# Patient Record
Sex: Female | Born: 1960 | Race: White | Hispanic: No | Marital: Married | Smoking: Never smoker
Health system: Southern US, Community
[De-identification: ages and names within clinical notes are randomized; demographics above are authoritative.]

## PROBLEM LIST (undated history)

## (undated) DIAGNOSIS — N3010 Interstitial cystitis (chronic) without hematuria: Secondary | ICD-10-CM

## (undated) DIAGNOSIS — Z9889 Other specified postprocedural states: Secondary | ICD-10-CM

## (undated) DIAGNOSIS — T8859XA Other complications of anesthesia, initial encounter: Secondary | ICD-10-CM

## (undated) DIAGNOSIS — R112 Nausea with vomiting, unspecified: Secondary | ICD-10-CM

## (undated) DIAGNOSIS — E039 Hypothyroidism, unspecified: Secondary | ICD-10-CM

## (undated) DIAGNOSIS — I10 Essential (primary) hypertension: Secondary | ICD-10-CM

## (undated) DIAGNOSIS — M5416 Radiculopathy, lumbar region: Secondary | ICD-10-CM

## (undated) HISTORY — DX: Interstitial cystitis (chronic) without hematuria: N30.10

## (undated) HISTORY — DX: Hypothyroidism, unspecified: E03.9

## (undated) HISTORY — PX: CHOLECYSTECTOMY: SHX55

## (undated) HISTORY — PX: OTHER SURGICAL HISTORY: SHX169

## (undated) HISTORY — DX: Essential (primary) hypertension: I10

## (undated) HISTORY — DX: Radiculopathy, lumbar region: M54.16

## (undated) HISTORY — PX: ABDOMINAL HYSTERECTOMY: SHX81

---

## 1999-11-24 HISTORY — PX: ABDOMINAL HYSTERECTOMY: SHX81

## 2014-02-21 ENCOUNTER — Encounter (INDEPENDENT_AMBULATORY_CARE_PROVIDER_SITE_OTHER): Payer: Self-pay | Admitting: *Deleted

## 2014-03-12 ENCOUNTER — Ambulatory Visit (INDEPENDENT_AMBULATORY_CARE_PROVIDER_SITE_OTHER): Payer: BC Managed Care – PPO | Admitting: Internal Medicine

## 2014-03-12 ENCOUNTER — Encounter (INDEPENDENT_AMBULATORY_CARE_PROVIDER_SITE_OTHER): Payer: Self-pay | Admitting: Internal Medicine

## 2014-03-12 ENCOUNTER — Other Ambulatory Visit (INDEPENDENT_AMBULATORY_CARE_PROVIDER_SITE_OTHER): Payer: Self-pay | Admitting: *Deleted

## 2014-03-12 ENCOUNTER — Telehealth (INDEPENDENT_AMBULATORY_CARE_PROVIDER_SITE_OTHER): Payer: Self-pay | Admitting: *Deleted

## 2014-03-12 VITALS — BP 124/82 | HR 72 | Temp 98.4°F | Ht 65.0 in | Wt 250.9 lb

## 2014-03-12 DIAGNOSIS — E039 Hypothyroidism, unspecified: Secondary | ICD-10-CM | POA: Insufficient documentation

## 2014-03-12 DIAGNOSIS — I10 Essential (primary) hypertension: Secondary | ICD-10-CM | POA: Insufficient documentation

## 2014-03-12 DIAGNOSIS — Z1211 Encounter for screening for malignant neoplasm of colon: Secondary | ICD-10-CM

## 2014-03-12 DIAGNOSIS — R1032 Left lower quadrant pain: Secondary | ICD-10-CM

## 2014-03-12 DIAGNOSIS — R10A2 Flank pain, left side: Secondary | ICD-10-CM

## 2014-03-12 DIAGNOSIS — R109 Unspecified abdominal pain: Secondary | ICD-10-CM

## 2014-03-12 MED ORDER — PEG 3350-KCL-NA BICARB-NACL 420 G PO SOLR: 4000.0000 mL | Freq: Once | ORAL | Status: DC

## 2014-03-12 NOTE — Telephone Encounter (Signed)
Patient needs trilyte 

## 2014-03-12 NOTE — Patient Instructions (Signed)
Screening colonoscopy.The risks and benefits such as perforation, bleeding, and infection were reviewed with the patient and is agreeable. 

## 2014-03-12 NOTE — Progress Notes (Signed)
Subjective:     Patient ID: Jasmine Lawson, female   DOB: 1961-07-13, 53 y.o.   MRN: [id number]  HPI Referred to our office by Dr. Quintin Alto for abdominal pain. She has never undergone a colonoscopy in the past.   She tells me when she has abdominal pain, it will be in her lower abdomen radiating into her back. She has a hx of hematuria. She has been seen by [facility] [place] for this. Kidney stone was ruled out. She continues to have blood in her urine. Symptoms off on an on for about for 1 1/2 yr. If she leans over sharp, she will have pain under her left ribs.  06/09/2013 CT scan with and without contrast for hematuria:  A specific cause for hematuria is not observed. 35mm left lower lobe pulmonary nodule. Mild sclerosis in the left pubic body, possible from pubic instability or low grade arthropathy. Appetite is good. No weight. When she has the abdominal pain, it will be in her left lower quadrant radiating up into her back.  She may have nausea with the pain.   Review of Systems Past Medical History  Diagnosis Date  . Hypothyroid   . Hypertension     Past Surgical History  Procedure Laterality Date  . Cesarean section      x 2  . Rt rotator cuff repair    . Cholecystectomy      non-functioning by Dr. Lindalou Hose  . Complete hysterectomy    . Fissure tear repair      Allergies  Allergen Reactions  . Codeine     Vomiting.    No current outpatient prescriptions on file prior to visit.   No current facility-administered medications on file prior to visit.         Objective:   Physical Exam  Filed Vitals:   03/12/14 1458  BP: 124/82  Pulse: 72  Temp: 98.4 F (36.9 C)  Height: 5\' 5"  (1.651 m)  Weight: 250 lb 14.4 oz (113.807 kg)  Alert and oriented. Skin warm and dry. Oral mucosa is moist.   . Sclera anicteric, conjunctivae is pink. Thyroid not enlarged. No cervical lymphadenopathy. Lungs clear. Heart regular rate and rhythm.  Abdomen is soft. Bowel sounds are positive.  No hepatomegaly. No abdominal masses felt. Slight tenderness left lower quadrant and left flank/back.   No edema to lower extremities.        Assessment:    Left lower quadrant pain radiating into her back. Doubt this is GI related. She gives a hx of hematuria. She has never undergone a colonoscopy in the past. Colonic neoplasm needs to be ruled out.    Plan:    Colonoscopy.The risks and benefits such as perforation, bleeding, and infection were reviewed with the patient and is agreeable.

## 2014-03-30 ENCOUNTER — Encounter (HOSPITAL_COMMUNITY): Payer: Self-pay | Admitting: Pharmacy Technician

## 2014-04-12 ENCOUNTER — Ambulatory Visit (HOSPITAL_COMMUNITY)
Admission: RE | Admit: 2014-04-12 | Discharge: 2014-04-12 | Disposition: A | Discharge status: Home / Self Care | Payer: BC Managed Care – PPO | Source: Ambulatory Visit | Attending: Internal Medicine | Admitting: Internal Medicine

## 2014-04-12 ENCOUNTER — Encounter (HOSPITAL_COMMUNITY)
Admission: RE | Disposition: A | Discharge status: Home / Self Care | Payer: Self-pay | Source: Ambulatory Visit | Attending: Internal Medicine

## 2014-04-12 ENCOUNTER — Encounter (HOSPITAL_COMMUNITY): Payer: Self-pay | Admitting: *Deleted

## 2014-04-12 DIAGNOSIS — Z885 Allergy status to narcotic agent status: Secondary | ICD-10-CM | POA: Insufficient documentation

## 2014-04-12 DIAGNOSIS — K644 Residual hemorrhoidal skin tags: Secondary | ICD-10-CM

## 2014-04-12 DIAGNOSIS — Z79899 Other long term (current) drug therapy: Secondary | ICD-10-CM | POA: Insufficient documentation

## 2014-04-12 DIAGNOSIS — R1032 Left lower quadrant pain: Secondary | ICD-10-CM

## 2014-04-12 DIAGNOSIS — E039 Hypothyroidism, unspecified: Secondary | ICD-10-CM | POA: Insufficient documentation

## 2014-04-12 DIAGNOSIS — Z1211 Encounter for screening for malignant neoplasm of colon: Secondary | ICD-10-CM

## 2014-04-12 DIAGNOSIS — I10 Essential (primary) hypertension: Secondary | ICD-10-CM | POA: Insufficient documentation

## 2014-04-12 DIAGNOSIS — K5730 Diverticulosis of large intestine without perforation or abscess without bleeding: Secondary | ICD-10-CM

## 2014-04-12 DIAGNOSIS — D126 Benign neoplasm of colon, unspecified: Secondary | ICD-10-CM

## 2014-04-12 HISTORY — PX: COLONOSCOPY: SHX5424

## 2014-04-12 SURGERY — COLONOSCOPY
Anesthesia: Moderate Sedation

## 2014-04-12 MED ORDER — SODIUM CHLORIDE 0.9 % IV SOLN
INTRAVENOUS | Status: DC
  Administered 2014-04-12: 07:00:00 via INTRAVENOUS

## 2014-04-12 MED ORDER — SIMETHICONE 40 MG/0.6ML PO SUSP
ORAL | Status: DC | PRN
  Administered 2014-04-12: 08:00:00

## 2014-04-12 MED ORDER — DICYCLOMINE HCL 10 MG PO CAPS: 10.0000 mg | ORAL_CAPSULE | Freq: Three times a day (TID) | ORAL | Status: DC | PRN

## 2014-04-12 MED ORDER — MIDAZOLAM HCL 5 MG/5ML IJ SOLN
INTRAMUSCULAR | Status: DC | PRN
  Administered 2014-04-12 (×5): 2 mg via INTRAVENOUS

## 2014-04-12 MED ORDER — MIDAZOLAM HCL 5 MG/5ML IJ SOLN
INTRAMUSCULAR | Status: AC
  Filled 2014-04-12: qty 10

## 2014-04-12 MED ORDER — MEPERIDINE HCL 50 MG/ML IJ SOLN
INTRAMUSCULAR | Status: DC | PRN
  Administered 2014-04-12 (×2): 25 mg via INTRAVENOUS

## 2014-04-12 MED ORDER — MEPERIDINE HCL 50 MG/ML IJ SOLN
INTRAMUSCULAR | Status: AC
  Filled 2014-04-12: qty 1

## 2014-04-12 NOTE — Op Note (Signed)
COLONOSCOPY PROCEDURE REPORT  PATIENT:  Jasmine Lawson  MR#:  [id number] Birthdate:  April 23, 1961, 53 y.o., female Endoscopist:  Dr. Rogene Houston, MD Referred By:  Dr. Manon Hilding, MD  Procedure Date: 04/12/2014  Procedure:   Colonoscopy  Indications:  Patient is 53 year old Caucasian female who is undergoing average risk screening colonoscopy. She presents with intermittent left-sided abdominal pain which is possibly not of GI origin.  Informed Consent:  The procedure and risks were reviewed with the patient and informed consent was obtained.  Medications:  Demerol 50 mg IV Versed 10 mg IV  Description of procedure:  After a digital rectal exam was performed, that colonoscope was advanced from the anus through the rectum and colon to the area of the cecum, ileocecal valve and appendiceal orifice. The cecum was deeply intubated. These structures were well-seen and photographed for the record. From the level of the cecum and ileocecal valve, the scope was slowly and cautiously withdrawn. The mucosal surfaces were carefully surveyed utilizing scope tip to flexion to facilitate fold flattening as needed. The scope was pulled down into the rectum where a thorough exam including retroflexion was performed. Terminal ileum was also examined.  Findings:   Prep excellent. Normal mucosa of terminal ileum. Normal mucosa of cecum, ascending colon, hepatic flexure, transverse colon, splenic flexure and descending colon. Small polyp ablated via cold biopsy from sigmoid colon. Three small diverticula in sigmoid colon. Normal rectal mucosa. Small hemorrhoids below the dentate line to   Therapeutic/Diagnostic Maneuvers Performed:  See above  Complications:  None  Cecal Withdrawal Time:  11 minutes  Impression:  Normal mucosa of terminal ileum. Three small diverticula in sigmoid colon. Small polyp ablated via cold biopsy from sigmoid colon. External hemorrhoids.  Recommendations:  Standard  instructions given. Patient advised to keep symptoms diary for 3 months until office visit. Dicyclomine 10 mg by mouth 3 times a day when necessary. I will contact patient with biopsy results and further recommendations. Given today's findings she could wait 10 years before next screening unless this polyp is sessile serrated polyp.  Rogene Houston  04/12/2014 8:18 AM  CC: Dr. Manon Hilding, MD & Dr. Rayne Du ref. provider found

## 2014-04-12 NOTE — Discharge Instructions (Signed)
Resume usual medications and diet. Dicyclomine 10 mg by mouth 3 times a day as needed No driving for 24 hours. Symptom diary for 3 months as to frequency and duration of abdominal pain and if there are any associated symptoms. Physician will call with biopsy results Office visit in 3 months.  Colonoscopy, Care After Refer to this sheet in the next few weeks. These instructions provide you with information on caring for yourself after your procedure. Your health care provider may also give you more specific instructions. Your treatment has been planned according to current medical practices, but problems sometimes occur. Call your health care provider if you have any problems or questions after your procedure. WHAT TO EXPECT AFTER THE PROCEDURE  After your procedure, it is typical to have the following:  A small amount of blood in your stool.  Moderate amounts of gas and mild abdominal cramping or bloating. HOME CARE INSTRUCTIONS  Do not drive, operate machinery, or sign important documents for 24 hours.  You may shower and resume your regular physical activities, but move at a slower pace for the first 24 hours.  Take frequent rest periods for the first 24 hours.  Walk around or put a warm pack on your abdomen to help reduce abdominal cramping and bloating.  Drink enough fluids to keep your urine clear or pale yellow.  You may resume your normal diet as instructed by your health care provider. Avoid heavy or fried foods that are hard to digest.  Avoid drinking alcohol for 24 hours or as instructed by your health care provider.  Only take over-the-counter or prescription medicines as directed by your health care provider.  If a tissue sample (biopsy) was taken during your procedure:  Do not take aspirin or blood thinners for 7 days, or as instructed by your health care provider.  Do not drink alcohol for 7 days, or as instructed by your health care provider.  Eat soft foods for  the first 24 hours. SEEK MEDICAL CARE IF: You have persistent spotting of blood in your stool 2 3 days after the procedure. SEEK IMMEDIATE MEDICAL CARE IF:  You have more than a small spotting of blood in your stool.  You pass large blood clots in your stool.  Your abdomen is swollen (distended).  You have nausea or vomiting.  You have a fever.  You have increasing abdominal pain that is not relieved with medicine.

## 2014-04-12 NOTE — H&P (Addendum)
Jasmine Lawson is an 53 y.o. female.   Chief Complaint: Patient is here for colonoscopy. HPI: Patient is 53 year old Caucasian female who presents with intermittent left upper and left lower quadrant abdominal pain of over a year duration. She has history of microscopic hematuria but workup has been negative other than interstitial cystitis. She denies melena or rectal bleeding. Her bowels move regularly. She may have more pain with physical activity. Pain comes and goes and she can go weeks without this pain. She has good appetite and denies weight loss. Last colonoscopy was 27 years ago prior to surgery for anal fissure. Family history is negative for CRC .  Past Medical History  Diagnosis Date  . Hypothyroid   . Hypertension   . Interstitial cystitis     Past Surgical History  Procedure Laterality Date  . Cesarean section      x 2  . Rt rotator cuff repair    . Cholecystectomy      non-functioning by Dr. Lindalou Hose  . Complete hysterectomy    . Fissure tear repair      Family History  Problem Relation Age of Onset  . Colon cancer Neg Hx    Social History:  reports that she has never smoked. She does not have any smokeless tobacco history on file. She reports that she does not drink alcohol or use illicit drugs.  Allergies:  Allergies  Allergen Reactions  . Codeine     Vomiting.    Medications Prior to Admission  Medication Sig Dispense Refill  . amitriptyline (ELAVIL) 25 MG tablet Take 25 mg by mouth at bedtime.      Marland Kitchen levothyroxine (SYNTHROID, LEVOTHROID) 50 MCG tablet Take 50 mcg by mouth daily before breakfast.      . lisinopril (PRINIVIL,ZESTRIL) 20 MG tablet Take 10 mg by mouth daily.        No results found for this or any previous visit (from the past 48 hour(s)). No results found.  ROS  Blood pressure 147/87, pulse 95, temperature 98.4 F (36.9 C), temperature source Oral, resp. rate 18, SpO2 97.00%. Physical Exam  Constitutional: She appears well-developed  and well-nourished.  HENT:  Mouth/Throat: Oropharynx is clear and moist.  Patient has double uvula  Eyes: Conjunctivae are normal. No scleral icterus.  Neck: No thyromegaly present.  Cardiovascular: Normal rate, regular rhythm and normal heart sounds.   No murmur heard. Respiratory: Effort normal and breath sounds normal.  GI: Soft. Tenderness: mild tenderness at LUQ.  Musculoskeletal: She exhibits no edema.  Lymphadenopathy:    She has no cervical adenopathy.  Neurological: She is alert.  Skin: Skin is warm and dry.     Assessment/Plan Left-sided abdominal pain possibility of non-GI origin. Colonoscopy primarily for screening purpose.  Jasmine Lawson 04/12/2014, 7:39 AM

## 2014-04-13 ENCOUNTER — Encounter (HOSPITAL_COMMUNITY): Payer: Self-pay | Admitting: Internal Medicine

## 2014-04-17 ENCOUNTER — Encounter (INDEPENDENT_AMBULATORY_CARE_PROVIDER_SITE_OTHER): Payer: Self-pay | Admitting: *Deleted

## 2014-07-31 ENCOUNTER — Ambulatory Visit (INDEPENDENT_AMBULATORY_CARE_PROVIDER_SITE_OTHER): Payer: BC Managed Care – PPO | Admitting: Internal Medicine

## 2014-07-31 ENCOUNTER — Encounter (INDEPENDENT_AMBULATORY_CARE_PROVIDER_SITE_OTHER): Payer: Self-pay | Admitting: Internal Medicine

## 2014-07-31 VITALS — BP 122/74 | HR 76 | Temp 98.0°F | Resp 18 | Ht 65.0 in | Wt 246.0 lb

## 2014-07-31 DIAGNOSIS — R109 Unspecified abdominal pain: Secondary | ICD-10-CM

## 2014-07-31 NOTE — Patient Instructions (Signed)
Call if symptoms progress.

## 2014-07-31 NOTE — Progress Notes (Signed)
Presenting complaint;  Followup for left-sided abdominal pain.  Subjective:  Patient is 53 year old Caucasian female who presents for evaluation of left-sided abdominal pain. Following her initial visit on 03/12/2014 she underwent colonoscopy on 04/12/2014 revealing mild sigmoid colon diverticulosis along with small polyp which was non-adenomatous. Patient's pain was felt to be both GI as well as musculoskeletal. She was begun on dicyclomine. Patient states she feels better. She is using dicyclomine on as-needed basis. She has had one episode of back pain since her colonoscopy over 3 months ago. However she continues to have pain predominantly left upper quadrant when she wakes up and she also has spells in her left side of her abdomen and chest is sore and may remain so for a day or 2. She has not been able to pinpoint triggers. Pain on left side of her abdomen is also associated with gurgling and nausea and these symptoms responds to dicyclomine. She is not having any side effects of this medication. She denies diarrhea or constipation melena or rectal bleeding. She is doing her best to lose weight. Her weight is down by 4 pounds since her last visit of April 2015.   Current Medications: Outpatient Encounter Prescriptions as of 07/31/2014  Medication Sig  . acyclovir (ZOVIRAX) 200 MG capsule Take 200 mg by mouth as needed.   Marland Kitchen amitriptyline (ELAVIL) 25 MG tablet Take 25 mg by mouth at bedtime.  . dicyclomine (BENTYL) 10 MG capsule Take 1 capsule (10 mg total) by mouth 3 (three) times daily as needed for spasms.  Marland Kitchen levothyroxine (SYNTHROID, LEVOTHROID) 50 MCG tablet Take 50 mcg by mouth daily before breakfast.  . lisinopril-hydrochlorothiazide (PRINZIDE,ZESTORETIC) 20-12.5 MG per tablet Take 0.5 tablets by mouth daily.   . [DISCONTINUED] lisinopril (PRINIVIL,ZESTRIL) 20 MG tablet Take 10 mg by mouth daily.     Objective: Blood pressure 122/74, pulse 76, temperature 98 F (36.7 C), temperature  source Oral, resp. rate 18, height 5\' 5"  (1.651 m), weight 246 lb (111.585 kg). Patient is alert and in no acute distress. Conjunctiva is pink. Sclera is nonicteric Oropharyngeal mucosa is normal. No neck masses or thyromegaly noted. Cardiac exam with regular rhythm normal S1 and S2. No murmur or gallop noted. Lungs are clear to auscultation. Abdomen symmetrical and soft. She has mild tenderness in left lower and upper quadrant both in superficial and deep palpation. No organomegaly or masses.  No LE edema or clubbing noted.  Assessment:  #1. Left-sided abdominal pain both in upper and lower quadrant appears to be partly visceral and partly musculoskeletal. She  has experienced only one bad episode since her colonoscopy on 04/12/2014. I do not believe she needs further workup unless symptoms change.   Plan:  Continue using dicyclomine on an as-needed basis. Patient will self monitor her symptoms and call if there is progression. Next colonoscopy would be in 10 years. Office visit on as-needed basis.

## 2014-11-13 ENCOUNTER — Encounter (INDEPENDENT_AMBULATORY_CARE_PROVIDER_SITE_OTHER): Payer: Self-pay

## 2015-02-07 ENCOUNTER — Other Ambulatory Visit (INDEPENDENT_AMBULATORY_CARE_PROVIDER_SITE_OTHER): Payer: Self-pay | Admitting: Internal Medicine

## 2015-02-07 MED ORDER — DICYCLOMINE HCL 10 MG PO CAPS: 10.0000 mg | ORAL_CAPSULE | Freq: Three times a day (TID) | ORAL | Status: DC | PRN

## 2015-02-07 NOTE — Telephone Encounter (Signed)
Refill on Dicyclomine

## 2015-03-22 ENCOUNTER — Encounter (INDEPENDENT_AMBULATORY_CARE_PROVIDER_SITE_OTHER): Payer: Self-pay

## 2015-12-10 ENCOUNTER — Ambulatory Visit (INDEPENDENT_AMBULATORY_CARE_PROVIDER_SITE_OTHER): Payer: BLUE CROSS/BLUE SHIELD | Admitting: Urology

## 2015-12-10 DIAGNOSIS — R1032 Left lower quadrant pain: Secondary | ICD-10-CM | POA: Diagnosis not present

## 2015-12-10 DIAGNOSIS — R311 Benign essential microscopic hematuria: Secondary | ICD-10-CM | POA: Diagnosis not present

## 2016-09-08 ENCOUNTER — Other Ambulatory Visit (HOSPITAL_COMMUNITY)
Admission: AD | Admit: 2016-09-08 | Discharge: 2016-09-08 | Disposition: A | Discharge status: Home / Self Care | Payer: BLUE CROSS/BLUE SHIELD | Source: Skilled Nursing Facility | Attending: Urology | Admitting: Urology

## 2016-09-08 ENCOUNTER — Ambulatory Visit (INDEPENDENT_AMBULATORY_CARE_PROVIDER_SITE_OTHER): Payer: BLUE CROSS/BLUE SHIELD | Admitting: Urology

## 2016-09-08 DIAGNOSIS — R1084 Generalized abdominal pain: Secondary | ICD-10-CM | POA: Diagnosis not present

## 2016-09-08 DIAGNOSIS — R311 Benign essential microscopic hematuria: Secondary | ICD-10-CM | POA: Diagnosis not present

## 2016-09-10 LAB — URINE CULTURE

## 2016-10-14 ENCOUNTER — Other Ambulatory Visit: Payer: Self-pay | Admitting: Urology

## 2016-10-14 DIAGNOSIS — R109 Unspecified abdominal pain: Secondary | ICD-10-CM

## 2017-07-02 ENCOUNTER — Other Ambulatory Visit (INDEPENDENT_AMBULATORY_CARE_PROVIDER_SITE_OTHER): Payer: Self-pay | Admitting: Internal Medicine

## 2017-08-03 ENCOUNTER — Encounter (HOSPITAL_COMMUNITY): Payer: Self-pay | Admitting: Emergency Medicine

## 2017-08-03 ENCOUNTER — Emergency Department (HOSPITAL_COMMUNITY)
Admission: EM | Admit: 2017-08-03 | Discharge: 2017-08-03 | Disposition: A | Discharge status: Home / Self Care | Payer: Commercial Managed Care - PPO | Attending: Emergency Medicine | Admitting: Emergency Medicine

## 2017-08-03 ENCOUNTER — Emergency Department (HOSPITAL_COMMUNITY): Payer: Commercial Managed Care - PPO

## 2017-08-03 DIAGNOSIS — I10 Essential (primary) hypertension: Secondary | ICD-10-CM | POA: Insufficient documentation

## 2017-08-03 DIAGNOSIS — R109 Unspecified abdominal pain: Secondary | ICD-10-CM | POA: Diagnosis present

## 2017-08-03 DIAGNOSIS — K580 Irritable bowel syndrome with diarrhea: Secondary | ICD-10-CM | POA: Diagnosis not present

## 2017-08-03 DIAGNOSIS — Z79899 Other long term (current) drug therapy: Secondary | ICD-10-CM | POA: Diagnosis not present

## 2017-08-03 DIAGNOSIS — R1084 Generalized abdominal pain: Secondary | ICD-10-CM

## 2017-08-03 LAB — COMPREHENSIVE METABOLIC PANEL
ALBUMIN: 3.9 g/dL (ref 3.5–5.0)
ALT: 16 U/L (ref 14–54)
ANION GAP: 9 (ref 5–15)
AST: 20 U/L (ref 15–41)
Alkaline Phosphatase: 65 U/L (ref 38–126)
BILIRUBIN TOTAL: 1.2 mg/dL (ref 0.3–1.2)
BUN: 11 mg/dL (ref 6–20)
CHLORIDE: 99 mmol/L — AB (ref 101–111)
CO2: 28 mmol/L (ref 22–32)
Calcium: 8.9 mg/dL (ref 8.9–10.3)
Creatinine, Ser: 0.55 mg/dL (ref 0.44–1.00)
GFR calc Af Amer: >60 mL/min (ref >60)
GFR calc non Af Amer: >60 mL/min (ref >60)
GLUCOSE: 107 mg/dL — AB (ref 65–99)
POTASSIUM: 2.8 mmol/L — AB (ref 3.5–5.1)
SODIUM: 136 mmol/L (ref 135–145)
TOTAL PROTEIN: 7.5 g/dL (ref 6.5–8.1)

## 2017-08-03 LAB — URINALYSIS, ROUTINE W REFLEX MICROSCOPIC
BACTERIA UA: NONE SEEN
Bilirubin Urine: NEGATIVE
Glucose, UA: NEGATIVE mg/dL
KETONES UR: 5 mg/dL — AB
LEUKOCYTES UA: NEGATIVE
NITRITE: NEGATIVE
Protein, ur: NEGATIVE mg/dL
SPECIFIC GRAVITY, URINE: 1.012 (ref 1.005–1.030)
pH: 5 (ref 5.0–8.0)

## 2017-08-03 LAB — CBC WITH DIFFERENTIAL/PLATELET
Basophils Absolute: 0 10*3/uL (ref 0.0–0.1)
Basophils Relative: 0 %
EOS PCT: 1 %
Eosinophils Absolute: 0.1 10*3/uL (ref 0.0–0.7)
HCT: 40.4 % (ref 36.0–46.0)
Hemoglobin: 13.3 g/dL (ref 12.0–15.0)
Lymphocytes Relative: 15 %
Lymphs Abs: 1.2 10*3/uL (ref 0.7–4.0)
MCH: 27.4 pg (ref 26.0–34.0)
MCHC: 32.9 g/dL (ref 30.0–36.0)
MCV: 83.3 fL (ref 78.0–100.0)
MONO ABS: 0.7 10*3/uL (ref 0.1–1.0)
MONOS PCT: 9 %
Neutro Abs: 6.1 10*3/uL (ref 1.7–7.7)
Neutrophils Relative %: 75 %
Platelets: 282 10*3/uL (ref 150–400)
RBC: 4.85 MIL/uL (ref 3.87–5.11)
RDW: 13.6 % (ref 11.5–15.5)
WBC: 8 10*3/uL (ref 4.0–10.5)

## 2017-08-03 LAB — LIPASE, BLOOD: Lipase: 23 U/L (ref 11–51)

## 2017-08-03 MED ORDER — POTASSIUM CHLORIDE ER 10 MEQ PO TBCR: 10.0000 meq | EXTENDED_RELEASE_TABLET | Freq: Two times a day (BID) | ORAL | 0 refills | Status: DC

## 2017-08-03 MED ORDER — SODIUM CHLORIDE 0.9 % IV SOLN: Freq: Once | INTRAVENOUS | Status: DC

## 2017-08-03 MED ORDER — ONDANSETRON HCL 4 MG PO TABS: 4.0000 mg | ORAL_TABLET | Freq: Four times a day (QID) | ORAL | 0 refills | Status: DC

## 2017-08-03 MED ORDER — GLYCOPYRROLATE 0.2 MG/ML IJ SOLN
0.2000 mg | Freq: Once | INTRAMUSCULAR | Status: AC
  Administered 2017-08-03: 0.2 mg via INTRAVENOUS
  Filled 2017-08-03: qty 1

## 2017-08-03 MED ORDER — SODIUM CHLORIDE 0.9 % IV BOLUS (SEPSIS)
1000.0000 mL | Freq: Once | INTRAVENOUS | Status: AC
  Administered 2017-08-03: 1000 mL via INTRAVENOUS

## 2017-08-03 MED ORDER — DICYCLOMINE HCL 20 MG PO TABS: 20.0000 mg | ORAL_TABLET | Freq: Two times a day (BID) | ORAL | 0 refills | Status: DC

## 2017-08-03 MED ORDER — ONDANSETRON HCL 4 MG/2ML IJ SOLN
4.0000 mg | Freq: Once | INTRAMUSCULAR | Status: AC
  Administered 2017-08-03: 4 mg via INTRAVENOUS
  Filled 2017-08-03: qty 2

## 2017-08-03 NOTE — ED Notes (Signed)
ED Provider at bedside. 

## 2017-08-03 NOTE — ED Provider Notes (Signed)
[facility] DEPT Provider Note   CSN: [id number] Arrival date & time: 08/03/17  0745     History   Chief Complaint Chief Complaint  Patient presents with  . Abdominal Pain    HPI Jasmine Lawson is a 56 y.o. female.  Chief complaint is abdominal pain  HPI: 79 female. History of past episodes of abdominal pain and left flank pain. Has had negative studies. These included CT scan, and colonoscopy. Dr. Melony Overly has diagnosed her with what sounds like irritable bowel syndrome and prescribed Bentyl.  She had a similar episode this morning starting at 2 AM it awakened her from sleep. She had multiple episodes of diarrhea over the last 2 days. No blood, pus, or mucus. No fever or chills. Nausea, but no vomiting. No urinary symptoms.  Past Medical History:  Diagnosis Date  . Hypertension   . Hypothyroid   . Interstitial cystitis     Patient Active Problem List   Diagnosis Date Noted  . Unspecified hypothyroidism 03/12/2014  . Essential hypertension, benign 03/12/2014  . Abdominal pain, left lower quadrant 03/12/2014  . Lt flank pain 03/12/2014    Past Surgical History:  Procedure Laterality Date  . CESAREAN SECTION     x 2  . CHOLECYSTECTOMY     non-functioning by Dr. Lindalou Hose  . COLONOSCOPY N/A 04/12/2014   Procedure: COLONOSCOPY;  Surgeon: Rogene Houston, MD;  Location: [facility] SUITE;  Service: Endoscopy;  Laterality: N/A;  225 - moved to [place] notified pt  . complete hysterectomy    . fissure tear repair    . Rt rotator cuff repair      OB History    No data available       Home Medications    Prior to Admission medications   Medication Sig Start Date End Date Taking? Authorizing Provider  hydrochlorothiazide (HYDRODIURIL) 25 MG tablet Take 25 mg by mouth daily.   Yes [provider]  levothyroxine (SYNTHROID, LEVOTHROID) 50 MCG tablet Take 50 mcg by mouth daily before breakfast.   Yes [provider]  dicyclomine (BENTYL) 20 MG tablet Take  1 tablet (20 mg total) by mouth 2 (two) times daily. 08/03/17   Tanna Furry, MD  ondansetron (ZOFRAN) 4 MG tablet Take 1 tablet (4 mg total) by mouth every 6 (six) hours. 08/03/17   Tanna Furry, MD    Family History Family History  Problem Relation Age of Onset  . Colon cancer Neg Hx     Social History Social History  Substance Use Topics  . Smoking status: Never Smoker  . Smokeless tobacco: Never Used  . Alcohol use No     Allergies   Codeine   Review of Systems Review of Systems  Constitutional: Negative for appetite change, chills, diaphoresis, fatigue and fever.  HENT: Negative for mouth sores, sore throat and trouble swallowing.   Eyes: Negative for visual disturbance.  Respiratory: Negative for cough, chest tightness, shortness of breath and wheezing.   Cardiovascular: Negative for chest pain.  Gastrointestinal: Positive for abdominal pain, diarrhea and nausea. Negative for abdominal distention and vomiting.  Endocrine: Negative for polydipsia, polyphagia and polyuria.  Genitourinary: Negative for dysuria, frequency and hematuria.  Musculoskeletal: Negative for gait problem.  Skin: Negative for color change, pallor and rash.  Neurological: Negative for dizziness, syncope, light-headedness and headaches.  Hematological: Does not bruise/bleed easily.  Psychiatric/Behavioral: Negative for behavioral problems and confusion.     Physical Exam Updated Vital Signs BP 127/78   Pulse  84   Temp 98.3 F (36.8 C) (Oral)   Resp (!) 24   Ht 5\' 5"  (1.651 m)   Wt 103.9 kg (229 lb)   SpO2 93%   BMI 38.11 kg/m   Physical Exam  Constitutional: She is oriented to person, place, and time. She appears well-developed and well-nourished. No distress.  HENT:  Head: Normocephalic.  Eyes: Pupils are equal, round, and reactive to light. Conjunctivae are normal. No scleral icterus.  Neck: Normal range of motion. Neck supple. No thyromegaly present.  Cardiovascular: Normal rate and  regular rhythm.  Exam reveals no gallop and no friction rub.   No murmur heard. Pulmonary/Chest: Effort normal and breath sounds normal. No respiratory distress. She has no wheezes. She has no rales.  Abdominal: Soft. Bowel sounds are normal. She exhibits no distension. There is no tenderness. There is no rebound.  Musculoskeletal: Normal range of motion.  Neurological: She is alert and oriented to person, place, and time.  Skin: Skin is warm and dry. No rash noted.  Psychiatric: She has a normal mood and affect. Her behavior is normal.     ED Treatments / Results  Labs (all labs ordered are listed, but only abnormal results are displayed) Labs Reviewed  COMPREHENSIVE METABOLIC PANEL - Abnormal; Notable for the following:       Result Value   Potassium 2.8 (*)    Chloride 99 (*)    Glucose, Bld 107 (*)    All other components within normal limits  URINALYSIS, ROUTINE W REFLEX MICROSCOPIC - Abnormal; Notable for the following:    Hgb urine dipstick LARGE (*)    Ketones, ur 5 (*)    Squamous Epithelial / LPF 0-5 (*)    All other components within normal limits  CBC WITH DIFFERENTIAL/PLATELET  LIPASE, BLOOD    EKG  EKG Interpretation None       Radiology Ct Renal Stone Study  Result Date: 08/03/2017 CLINICAL DATA:  Left flank pain for 1 day, hematuria EXAM: CT ABDOMEN AND PELVIS WITHOUT CONTRAST TECHNIQUE: Multidetector CT imaging of the abdomen and pelvis was performed following the standard protocol without IV contrast. COMPARISON:  None. FINDINGS: Lower chest: Lung bases are clear. No effusions. Heart is normal size. Hepatobiliary: No focal liver abnormality is seen. Status post cholecystectomy. No biliary dilatation. Pancreas: No focal abnormality or ductal dilatation. Spleen: No focal abnormality.  Normal size. Adrenals/Urinary Tract: No adrenal abnormality. No focal renal abnormality. No stones or hydronephrosis. Urinary bladder is unremarkable. Stomach/Bowel: Normal  appendix Stomach, large and small bowel grossly unremarkable. Vascular/Lymphatic: No evidence of aneurysm or adenopathy. Reproductive: Prior hysterectomy.  No adnexal masses. Other: No free fluid or free air. Musculoskeletal: No acute bony abnormality. IMPRESSION: No renal or ureteral stones.  No hydronephrosis. Prior cholecystectomy. No acute findings. Electronically Signed   By: Rolm Baptise M.D.   On: 08/03/2017 10:48    Procedures Procedures (including critical care time)  Medications Ordered in ED Medications  0.9 %  sodium chloride infusion (not administered)  sodium chloride 0.9 % bolus 1,000 mL (1,000 mLs Intravenous New Bag/Given 08/03/17 0901)  ondansetron (ZOFRAN) injection 4 mg (4 mg Intravenous Given 08/03/17 0901)  glycopyrrolate (ROBINUL) injection 0.2 mg (0.2 mg Intravenous Given 08/03/17 0901)     Initial Impression / Assessment and Plan / ED Course  I have reviewed the triage vital signs and the nursing notes.  Pertinent labs & imaging results that were available during my care of the patient were reviewed by me  and considered in my medical decision making (see chart for details).     CT scan does not show acute findings. Does have hematuria. Otherwise has mild hypokalemia. We'll replete her potassium orally. Plan Bentyl, Zofran, bland diet, liquids, GI follow-up.  Final Clinical Impressions(s) / ED Diagnoses   Final diagnoses:  Generalized abdominal pain  Irritable bowel syndrome with diarrhea    New Prescriptions New Prescriptions   DICYCLOMINE (BENTYL) 20 MG TABLET    Take 1 tablet (20 mg total) by mouth 2 (two) times daily.   ONDANSETRON (ZOFRAN) 4 MG TABLET    Take 1 tablet (4 mg total) by mouth every 6 (six) hours.     Tanna Furry, MD 08/03/17 1140

## 2017-08-03 NOTE — ED Triage Notes (Signed)
Abdominal pain radiating into back, multiple episodes of diarrhea on set yesterday

## 2017-08-03 NOTE — Discharge Instructions (Signed)
Liquids and bland diet. Rice, toast, bananas, applesauce, etc. Bentyl for cramps. Zofran for nausea. Follow-up with Dr.Rehman.

## 2017-08-24 ENCOUNTER — Encounter (INDEPENDENT_AMBULATORY_CARE_PROVIDER_SITE_OTHER): Payer: Self-pay | Admitting: Internal Medicine

## 2017-08-24 ENCOUNTER — Ambulatory Visit (INDEPENDENT_AMBULATORY_CARE_PROVIDER_SITE_OTHER): Payer: Commercial Managed Care - PPO | Admitting: Internal Medicine

## 2017-08-24 VITALS — BP 128/90 | HR 76 | Temp 99.3°F | Resp 18 | Ht 65.0 in | Wt 235.0 lb

## 2017-08-24 DIAGNOSIS — K589 Irritable bowel syndrome without diarrhea: Secondary | ICD-10-CM | POA: Diagnosis not present

## 2017-08-24 DIAGNOSIS — R1012 Left upper quadrant pain: Secondary | ICD-10-CM

## 2017-08-24 NOTE — Progress Notes (Signed)
Presenting complaint;  Recent episode of severe abdominal pain and diarrhea.  History of present illness.:  Patient is 56 year old Caucasian female who has history of irritable bowel syndrome and was doing well on when necessary dicyclomine until 08/01/2017 when she developed nausea and abdominal pain. Pain was primarily in left side of abdomen. She felt tightness and tugging sensation. A day later pain was worse and she felt she had low-grade fever and did not have an appetite. By the evening pain became intense and she was not able to rest. She noted some relief with dicyclomine. She developed explosive diarrhea that night. She did not experience melena or rectal bleeding. She recalls she had 5-7 bowel movements. She was seen in emergency room on 08/03/2017. Lab studies were unremarkable except serum potassium of 2.8 for which she was treated. She also had unenhanced abdominopelvic CT and it is negative for urolithiasis or thickened small or large bowel. Urine analysis revealed 6-30 RBCs and mild ketones. She was felt to have flareup of her IBS and was discharged to be followed there. She also had normal serum lipase and urine culture was negative. Since his episode she has been using dicyclomine on schedule. She is feeling better but still not back to her baseline. She has lost close to 5 pounds. Her appetite is improving but she is afraid to eat full meals. She still has discomfort in left upper quadrant. She was treated with tetracycline for 10 days in early August but does not remember having had diarrhea or other symptoms. She has not traveled outside the country recently and she drinks bottled water. Last colonoscopy was in May 2015 revealing mild sigmoid colon diverticulosis and non-adenomatous polyp. He has taken Tylenol and/or Advil for left upper quadrant abdominal pain and it helps. She was recently found to have small goiter. She had follow-up testing and serum potassium was normal at 3.8 on  08/16/2017. She states she has never experienced hypokalemia with diuretic therapy until recent illness.    Current Medications: Outpatient Encounter Prescriptions as of 08/24/2017  Medication Sig  . conjugated estrogens (PREMARIN) vaginal cream Place 1 Applicatorful vaginally. Patient states that she uses twice a week.  . dicyclomine (BENTYL) 20 MG tablet Take 1 tablet (20 mg total) by mouth 2 (two) times daily.  . hydrochlorothiazide (HYDRODIURIL) 25 MG tablet Take 25 mg by mouth daily.  Marland Kitchen levothyroxine (SYNTHROID, LEVOTHROID) 50 MCG tablet Take 50 mcg by mouth daily before breakfast.  . [DISCONTINUED] ondansetron (ZOFRAN) 4 MG tablet Take 1 tablet (4 mg total) by mouth every 6 (six) hours. (Patient not taking: Reported on 08/24/2017)  . [DISCONTINUED] potassium chloride (K-DUR) 10 MEQ tablet Take 1 tablet (10 mEq total) by mouth 2 (two) times daily. (Patient not taking: Reported on 08/24/2017)   No facility-administered encounter medications on file as of 08/24/2017.    Past medical history:  Hypertension. Hypothyroidism. She has small goiter. Irritable bowel syndrome. Obesity. Osteoarthrosis of right knee. She's had 3 injections to this knee. She was diagnosed with interstitial cystitis by Dr. Barrie Dunker. Tonsillectomy several years ago. BSO with hysterectomy 18 years ago for endometriosis. Laparoscopic cholecystectomy 15 years ago. Rotator cuff tear repair 8 years ago. Screening colonoscopy was in May 2015 with removal of a small polyp and was not an adenoma. She had mild sigmoid colon diverticulosis.   Allergies: Allergies  Allergen Reactions  . Codeine     Vomiting.    Family history:  Both parents are deceased. She has one brother in good  health. She has 5 sisters living ages 8 through 39. One sister had rheumatoid arthritis and died a year 52.   Social history:  She is married. She has been working at [employer] for 25 years and prior to that  she worked at grandfather [employer] for 13 years. She has 2 daughters. One of her daughters works at [employer]. She does not smoke cigarettes or drink alcohol.  Physical examination: Blood pressure 128/90, pulse 76, temperature 99.3 F (37.4 C), temperature source Oral, resp. rate 18, height 5\' 5"  (1.651 m), weight 235 lb (106.6 kg). Patient is alert and in no acute distress. Conjunctiva is pink. Sclera is nonicteric Oropharyngeal mucosa is normal. No neck masses. She has prominent thyroid in mid region. Cardiac exam with regular rhythm normal S1 and S2. No murmur or gallop noted. Lungs are clear to auscultation. Abdomen is full. Bowel sounds are normal. On palpation abdomen is soft. She has tenderness in left upper quadrant on superficial palpation. No organomegaly or masses. No LE edema or clubbing noted.  Labs/studies Results: Lab data from 08/03/2017  WBC 8.0, H&H 13.3 and 40.4 and platelet count 280 2K. Differential was normal.   Serum sodium 136, potassium 2.8, chloride 99, CO2 28 and glucose 107. BUN 11 and creatinine 0.55.   Bilirubin 1.2, AP 65, AST 20, ALT 16, total protein 7.5 and albumin 3.9.  Serum calcium 8.9.   Urinalysis pertinent for positive ketones and 6-30 RBCs per high-power field.   Unenhanced abdominopelvic CT images reviewed. No abnormality noted to loops of small bowel or colon.  No evidence of urolithiasis.    Assessment:  #1. Recent episode of acute illness appears to be food borne illness rather than flareup of IBS.  #2. Irritable bowel syndrome. Ongoing symptoms most likely triggered by acute illness that she suffered few weeks ago. She can continue using dicyclomine on schedule until she is back to baseline and which time she can go back to using it on when necessary basis.  #3. LUQ abdominal pain. Most of her pain appears to be wall pain but some of this pain is due to IBS. No further workup at this time unless pain gets  worse.   Recommendations:  Patient reassured. Patient will continue dicyclomine 10 mg twice daily until she is back to baseline at which time she will use it on when necessary basis. She can use OTC preparation such as acetaminophen or ibuprofen for LUQ pain. Acetaminophen dose should not exceed 2 g per day and ibuprofen dose should not exceed 1200 mg per day. She will call if symptoms worsen. Office visit on as-needed basis.

## 2017-08-24 NOTE — Patient Instructions (Addendum)
Can take third dose of dicyclomine on as-needed basis if you need to.  Notify if symptoms of abdominal pain and diarrhea relapse.

## 2017-08-26 ENCOUNTER — Ambulatory Visit (INDEPENDENT_AMBULATORY_CARE_PROVIDER_SITE_OTHER): Payer: Commercial Managed Care - PPO | Admitting: Internal Medicine

## 2018-03-10 ENCOUNTER — Other Ambulatory Visit: Payer: Self-pay | Admitting: Neurosurgery

## 2018-03-10 DIAGNOSIS — M5416 Radiculopathy, lumbar region: Secondary | ICD-10-CM

## 2018-03-17 ENCOUNTER — Ambulatory Visit
Admission: RE | Admit: 2018-03-17 | Discharge: 2018-03-17 | Disposition: A | Discharge status: Home / Self Care | Payer: Commercial Managed Care - PPO | Source: Ambulatory Visit | Attending: Neurosurgery | Admitting: Neurosurgery

## 2018-03-17 ENCOUNTER — Other Ambulatory Visit: Payer: Self-pay | Admitting: Neurosurgery

## 2018-03-17 DIAGNOSIS — M5416 Radiculopathy, lumbar region: Secondary | ICD-10-CM

## 2018-05-04 ENCOUNTER — Encounter: Payer: Self-pay | Admitting: Cardiology

## 2018-05-04 NOTE — Progress Notes (Signed)
Cardiology Office Note  Date: 05/05/2018   ID: Jasmine Lawson, DOB November 10, 1961, MRN [id number]  PCP: Manon Hilding, MD  Consulting Cardiologist: Rozann Lesches, MD   Chief Complaint  Patient presents with  . Palpitations    History of Present Illness: Jasmine Lawson is a 57 y.o. female referred for cardiology consultation by Dr. Quintin Alto for evaluation of palpitations.  She states that she has been undergoing adjustments in her thyroid regimen over the last several months.  Since September of last year she has been feeling palpitations, describes a rapid heartbeat or feeling of anxiety in her chest.  This sometimes happens at nighttime when she turns over in bed, other times during the day but not with any specific trigger.  She has had no associated lightheadedness or syncope, but does feel somewhat short of breath when it happens.  No exertional chest pain.  She has been trying to lose some weight through diet and also walking for exercise.  Current medications are reviewed below.  She states that atenolol has been helpful in terms of her palpitations.  She is also on Aldactone with history of hypertension.  I personally reviewed her ECG today which shows normal sinus rhythm.  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  Past Medical History:  Diagnosis Date  . Hypertension   . Hypothyroidism   . Interstitial cystitis   . Lumbar radiculopathy     Past Surgical History:  Procedure Laterality Date  . ABDOMINAL HYSTERECTOMY    . CESAREAN SECTION     x 2  . CHOLECYSTECTOMY     non-functioning by Dr. Lindalou Hose  . COLONOSCOPY N/A 04/12/2014   Procedure: COLONOSCOPY;  Surgeon: Rogene Houston, MD;  [facility];  Service: Endoscopy;  Laterality: N/A;  225 - moved to [place] notified pt  . Fissure tear repair    . Rt rotator cuff repair      Current Outpatient Medications  Medication Sig Dispense Refill  . atenolol (TENORMIN) 25 MG  tablet Take by mouth daily.    . cholecalciferol (VITAMIN D) 1000 units tablet Take 1,000 Units by mouth daily.    Marland Kitchen conjugated estrogens (PREMARIN) vaginal cream Place 1 Applicatorful vaginally. Patient states that she uses twice a week.    . levothyroxine (SYNTHROID, LEVOTHROID) 75 MCG tablet Take 75 mcg by mouth daily before breakfast.    . spironolactone (ALDACTONE) 25 MG tablet Take 25 mg by mouth daily.     No current facility-administered medications for this visit.    Allergies:  Codeine   Social History: The patient  reports that she has never smoked. She has never used smokeless tobacco. She reports that she does not drink alcohol or use drugs.   Family History: The patient's family history includes CAD in her father; Deep vein thrombosis in her mother; Heart failure in her mother.   ROS:  Please see the history of present illness. Otherwise, complete review of systems is positive for chronic lower back and leg pain with lumbar radiculopathy.  All other systems are reviewed and negative.   Physical Exam: VS:  BP 124/90   Pulse 87   Ht 5\' 5"  (1.651 m)   Wt 244 lb (110.7 kg)   SpO2 97%   BMI 40.60 kg/m , BMI Body mass index is 40.6 kg/m.  Wt Readings from Last 3 Encounters:  05/05/18 244 lb (110.7 kg)  08/24/17 235 lb (106.6 kg)  08/03/17 229  lb (103.9 kg)    General: Obese woman, appears comfortable at rest. HEENT: Conjunctiva and lids normal, oropharynx clear. Neck: Supple, no elevated JVP or carotid bruits, no thyromegaly. Lungs: Clear to auscultation, nonlabored breathing at rest. Cardiac: Regular rate and rhythm, no S3 or significant systolic murmur, no pericardial rub. Abdomen: Soft, nontender, bowel sounds present. Extremities: No pitting edema, distal pulses 2+. Skin: Warm and dry. Musculoskeletal: No kyphosis. Neuropsychiatric: Alert and oriented x3, affect grossly appropriate.  ECG: No old tracing available for review.  Recent Labwork: 08/03/2017: ALT 16;  AST 20; BUN 11; Creatinine, Ser 0.55; Hemoglobin 13.3; Platelets 282; Potassium 2.8; Sodium 136   Other Studies Reviewed Today:  Lumbar spine films 03/17/2018: FINDINGS: The lumbar vertebrae are in normal alignment. There is partial lumbarization of S1. Intervertebral disc spaces appear normal. No compression deformity is seen. Minimal anterior osteophyte formation is noted at L1-2. The SI joints are corticated. The bowel gas pattern is nonspecific. Surgical clips are noted in the right upper quadrant from prior cholecystectomy.  IMPRESSION: 1. Normal alignment with normal intervertebral disc spaces. 2. No compression deformity is seen.  Assessment and Plan:  1.  Intermittent palpitations as outlined above, no associated chest pain or syncope.  ECG reviewed and shows normal sinus rhythm.  She has felt somewhat better on atenolol, also undergoing adjustments in her thyroid regimen.  Plan is to obtain an echocardiogram to evaluate cardiac structure and function, mainly to exclude cardiomyopathy.  We will also obtain a 7-day event monitor to assess for paroxysmal arrhythmia.  2.  Hypothyroidism, on Synthroid.  She has had endocrinology evaluation.  3.  Essential hypertension, on Aldactone and atenolol, followed by Dr. Quintin Alto.   Current medicines were reviewed with the patient today.   Orders Placed This Encounter  Procedures  . Cardiac event monitor  . EKG 12-Lead  . ECHOCARDIOGRAM COMPLETE    Disposition: Call with test results.  Signed, Satira Sark, MD, [facility] 05/05/2018 10:04 AM    [facility] [place], [place], [place] [address] Phone: [phone]; Fax: [phone]

## 2018-05-05 ENCOUNTER — Encounter: Payer: Self-pay | Admitting: Cardiology

## 2018-05-05 ENCOUNTER — Telehealth: Payer: Self-pay | Admitting: Cardiology

## 2018-05-05 ENCOUNTER — Ambulatory Visit (INDEPENDENT_AMBULATORY_CARE_PROVIDER_SITE_OTHER): Payer: Commercial Managed Care - PPO | Admitting: Cardiology

## 2018-05-05 VITALS — BP 124/90 | HR 87 | Ht 65.0 in | Wt 244.0 lb

## 2018-05-05 DIAGNOSIS — R002 Palpitations: Secondary | ICD-10-CM | POA: Diagnosis not present

## 2018-05-05 DIAGNOSIS — I10 Essential (primary) hypertension: Secondary | ICD-10-CM

## 2018-05-05 DIAGNOSIS — R0602 Shortness of breath: Secondary | ICD-10-CM

## 2018-05-05 DIAGNOSIS — E039 Hypothyroidism, unspecified: Secondary | ICD-10-CM

## 2018-05-05 NOTE — Telephone Encounter (Signed)
Echo- shortness of breath Scheduled at [facility] June 17 arrive at 9:15

## 2018-05-05 NOTE — Telephone Encounter (Signed)
7 day event- palps

## 2018-05-05 NOTE — Patient Instructions (Signed)
Medication Instructions:  Your physician recommends that you continue on your current medications as directed. Please refer to the Current Medication list given to you today.  Labwork: NONE  Testing/Procedures: Your physician has requested that you have an echocardiogram. Echocardiography is a painless test that uses sound waves to create images of your heart. It provides your doctor with information about the size and shape of your heart and how well your heart's chambers and valves are working. This procedure takes approximately one hour. There are no restrictions for this procedure.  Your physician has recommended that you wear an event monitor FOR 7 DAYS. Event monitors are medical devices that record the heart's electrical activity. Doctors most often [place] these monitors to diagnose arrhythmias. Arrhythmias are problems with the speed or rhythm of the heartbeat. The monitor is a small, portable device. You can wear one while you do your normal daily activities. This is usually used to diagnose what is causing palpitations/syncope (passing out).    Follow-Up: Your physician recommends that you schedule a follow-up appointment PENDING TEST RESULTS   Any Other Special Instructions Will Be Listed Below (If Applicable).  If you need a refill on your cardiac medications before your next appointment, please call your pharmacy.

## 2018-05-10 ENCOUNTER — Telehealth: Payer: Self-pay

## 2018-05-10 ENCOUNTER — Ambulatory Visit (HOSPITAL_COMMUNITY)
Admission: RE | Admit: 2018-05-10 | Discharge: 2018-05-10 | Disposition: A | Discharge status: Home / Self Care | Payer: Commercial Managed Care - PPO | Source: Ambulatory Visit | Attending: Cardiology | Admitting: Cardiology

## 2018-05-10 DIAGNOSIS — R0602 Shortness of breath: Secondary | ICD-10-CM

## 2018-05-10 DIAGNOSIS — I10 Essential (primary) hypertension: Secondary | ICD-10-CM | POA: Insufficient documentation

## 2018-05-10 DIAGNOSIS — E039 Hypothyroidism, unspecified: Secondary | ICD-10-CM | POA: Diagnosis not present

## 2018-05-10 NOTE — Telephone Encounter (Signed)
Patient notified. Routed to PCP 

## 2018-05-10 NOTE — Telephone Encounter (Signed)
-----   Message from Erma Heritage, [place] sent at 05/10/2018 12:11 PM EDT ----- Covering for Dr. Domenic Polite - Please let the patient know her echocardiogram showed normal pumping function of the heart with no wall motion abnormalities. No significant valve abnormalities identified.  Please forward a copy to Manon Hilding, MD. Thank you.

## 2018-05-10 NOTE — Progress Notes (Signed)
*  PRELIMINARY RESULTS* Echocardiogram 2D Echocardiogram has been performed.  Leavy Cella 05/10/2018, 10:32 AM

## 2018-09-19 ENCOUNTER — Other Ambulatory Visit (HOSPITAL_COMMUNITY): Payer: Self-pay | Admitting: Endocrinology

## 2018-09-19 DIAGNOSIS — R221 Localized swelling, mass and lump, neck: Secondary | ICD-10-CM

## 2018-09-19 DIAGNOSIS — E049 Nontoxic goiter, unspecified: Secondary | ICD-10-CM

## 2018-09-27 ENCOUNTER — Emergency Department (HOSPITAL_COMMUNITY): Payer: Commercial Managed Care - PPO

## 2018-09-27 ENCOUNTER — Emergency Department (HOSPITAL_COMMUNITY)
Admission: EM | Admit: 2018-09-27 | Discharge: 2018-09-27 | Disposition: A | Discharge status: Home / Self Care | Payer: Commercial Managed Care - PPO | Attending: Emergency Medicine | Admitting: Emergency Medicine

## 2018-09-27 ENCOUNTER — Encounter (HOSPITAL_COMMUNITY): Payer: Self-pay | Admitting: Emergency Medicine

## 2018-09-27 ENCOUNTER — Other Ambulatory Visit: Payer: Self-pay

## 2018-09-27 DIAGNOSIS — R0789 Other chest pain: Secondary | ICD-10-CM | POA: Diagnosis not present

## 2018-09-27 DIAGNOSIS — I10 Essential (primary) hypertension: Secondary | ICD-10-CM | POA: Insufficient documentation

## 2018-09-27 DIAGNOSIS — E039 Hypothyroidism, unspecified: Secondary | ICD-10-CM | POA: Diagnosis not present

## 2018-09-27 DIAGNOSIS — Z79899 Other long term (current) drug therapy: Secondary | ICD-10-CM | POA: Insufficient documentation

## 2018-09-27 DIAGNOSIS — R221 Localized swelling, mass and lump, neck: Secondary | ICD-10-CM | POA: Insufficient documentation

## 2018-09-27 LAB — CBC
HEMATOCRIT: 41.6 % (ref 36.0–46.0)
Hemoglobin: 13.2 g/dL (ref 12.0–15.0)
MCH: 28.1 pg (ref 26.0–34.0)
MCHC: 31.7 g/dL (ref 30.0–36.0)
MCV: 88.7 fL (ref 80.0–100.0)
NRBC: 0 % (ref 0.0–0.2)
PLATELETS: 326 10*3/uL (ref 150–400)
RBC: 4.69 MIL/uL (ref 3.87–5.11)
RDW: 12.8 % (ref 11.5–15.5)
WBC: 7.5 10*3/uL (ref 4.0–10.5)

## 2018-09-27 LAB — BASIC METABOLIC PANEL
ANION GAP: 8 (ref 5–15)
BUN: 10 mg/dL (ref 6–20)
CHLORIDE: 106 mmol/L (ref 98–111)
CO2: 25 mmol/L (ref 22–32)
CREATININE: 0.6 mg/dL (ref 0.44–1.00)
Calcium: 9.3 mg/dL (ref 8.9–10.3)
GFR calc non Af Amer: >60 mL/min (ref >60)
Glucose, Bld: 100 mg/dL — ABNORMAL HIGH (ref 70–99)
Potassium: 3.6 mmol/L (ref 3.5–5.1)
SODIUM: 139 mmol/L (ref 135–145)

## 2018-09-27 LAB — I-STAT TROPONIN, ED: Troponin i, poc: 0 ng/mL (ref 0.00–0.08)

## 2018-09-27 MED ORDER — IOHEXOL 300 MG/ML  SOLN
75.0000 mL | Freq: Once | INTRAMUSCULAR | Status: AC | PRN
  Administered 2018-09-27: 75 mL via INTRAVENOUS

## 2018-09-27 NOTE — ED Triage Notes (Signed)
Pt complaining of increased swelling in neck/collar bone area. Pt seeing endocrinologist last 1.5 weeks ago. Has CT scan scheduled for November 15th. Pt reports increased feeling of SOB,and pressure on left side of neck.

## 2018-09-27 NOTE — ED Provider Notes (Signed)
[facility] EMERGENCY DEPARTMENT Provider Note   CSN: [id number] Arrival date & time: 09/27/18  0901     History   Chief Complaint Chief Complaint  Patient presents with  . Oral Swelling    HPI Jasmine Lawson is a 57 y.o. female.  Patient is a 58 year old female presenting with left-sided neck swelling and shortness of breath.  PMH significant for HTN and hypothyroidism currently being worked up by endocrinology planned CT on November 15.  Patient currently seeing endocrinology for enlarged thyroid particularly on right side over the last year currently on Synthroid 75 mcg daily.  Patient states she more recently over the last 2 months has developed increased swelling on her left neck along with her clavicle.  She is presented today because she developed shortness of breath worse with exertion and feels a "weird feeling" in the associated areas but denies chest pain.  She does have palpitations due to her thyroid which improved on the Synthroid.  Patient previously evaluated by cardiology for history of similar symptoms with unremarkable echocardiogram.  Patient states she has not underwent stress test or evaluation for coronary artery disease.  She is a never smoker.  She denies associated dysphasia symptoms including nausea or vomiting, decreased appetite over the last 2 months since her neck began to swell.  She does endorse pain over the area including the clavicle especially when she wears a seatbelt.  Has history of trauma.     Past Medical History:  Diagnosis Date  . Hypertension   . Hypothyroidism   . Interstitial cystitis   . Lumbar radiculopathy     Patient Active Problem List   Diagnosis Date Noted  . Unspecified hypothyroidism 03/12/2014  . Essential hypertension, benign 03/12/2014  . Abdominal pain, left lower quadrant 03/12/2014  . Lt flank pain 03/12/2014    Past Surgical History:  Procedure Laterality Date  . ABDOMINAL HYSTERECTOMY    . CESAREAN SECTION     x 2  . CHOLECYSTECTOMY     non-functioning by Dr. Lindalou Hose  . COLONOSCOPY N/A 04/12/2014   Procedure: COLONOSCOPY;  Surgeon: Rogene Houston, MD;  Location: [facility];  Service: Endoscopy;  Laterality: N/A;  225 - moved to [place] notified pt  . Fissure tear repair    . Rt rotator cuff repair       OB History   None      Home Medications    Prior to Admission medications   Medication Sig Start Date End Date Taking? Authorizing Provider  atenolol (TENORMIN) 25 MG tablet Take by mouth daily.    [provider]  cholecalciferol (VITAMIN D) 1000 units tablet Take 1,000 Units by mouth daily.    [provider]  conjugated estrogens (PREMARIN) vaginal cream Place 1 Applicatorful vaginally. Patient states that she uses twice a week.    [provider]  levothyroxine (SYNTHROID, LEVOTHROID) 75 MCG tablet Take 75 mcg by mouth daily before breakfast.    [provider]  spironolactone (ALDACTONE) 25 MG tablet Take 25 mg by mouth daily.    [provider]    Family History Family History  Problem Relation Age of Onset  . Heart failure Mother   . Deep vein thrombosis Mother   . CAD Father   . Colon cancer Neg Hx     Social History Social History   Tobacco Use  . Smoking status: Never Smoker  . Smokeless tobacco: Never Used  Substance Use Topics  . Alcohol use:  No  . Drug use: No     Allergies   Codeine   Review of Systems Review of Systems  Constitutional: Negative for chills, diaphoresis, fatigue and fever.  HENT: Negative for ear pain, trouble swallowing and voice change.   Eyes: Negative for pain and visual disturbance.  Respiratory: Positive for shortness of breath. Negative for cough.   Cardiovascular: Positive for chest pain and palpitations. Negative for leg swelling.  Gastrointestinal: Negative for abdominal pain, nausea and vomiting.  Endocrine: Negative for cold intolerance, heat intolerance, polydipsia and  polyuria.  Musculoskeletal: Positive for neck pain. Negative for arthralgias and neck stiffness.  Skin: Negative for color change and rash.  Neurological: Negative for weakness and headaches.  All other systems reviewed and are negative.    Physical Exam Updated Vital Signs BP (!) 161/86 (BP Location: Right Arm)   Pulse 81   Temp 98.4 F (36.9 C) (Oral)   Resp 20   Ht 5\' 5"  (1.651 m)   Wt 106.1 kg   SpO2 96%   BMI 38.94 kg/m   Physical Exam  Constitutional: She appears well-developed and well-nourished. No distress.  HENT:  Head: Normocephalic and atraumatic.  Bilateral uvula present, no appreciable abscess in posterior pharynx  Eyes: Pupils are equal, round, and reactive to light. Conjunctivae and EOM are normal.  No proptosis  Neck: Neck supple. No tracheal deviation present. Thyromegaly present.  Palpable soft nontender right-sided goiter just above medial surface of right clavicle, and some increase soft tissue swelling with associated tenderness to palpation of the left clavicle particularly over medial surface without palpable mass on left neck  Cardiovascular: Normal rate and regular rhythm.  No murmur heard. Pulmonary/Chest: Effort normal and breath sounds normal. No respiratory distress.  Abdominal: Soft. There is no tenderness.  Musculoskeletal: She exhibits no edema.  Lymphadenopathy:    She has no cervical adenopathy.  Neurological: She is alert.  Skin: Skin is warm and dry. She is not diaphoretic.  Psychiatric: She has a normal mood and affect.  Nursing note and vitals reviewed.    ED Treatments / Results  Labs (all labs ordered are listed, but only abnormal results are displayed) Labs Reviewed - No data to display  EKG None  Radiology No results found.  Procedures Procedures (including critical care time)  Medications Ordered in ED Medications - No data to display   Initial Impression / Assessment and Plan / ED Course  I have reviewed the  triage vital signs and the nursing notes.  Pertinent labs & imaging results that were available during my care of the patient were reviewed by me and considered in my medical decision making (see chart for details).  Patient is a 58 year old female presenting with left-sided neck swelling and shortness of breath.  PMH significant for HTN and hypothyroidism currently being worked up by endocrinology planned CT on November 15.  Patient presenting with vague in terms of left-sided chest discomfort with more focus on the clavicle and thyroid region.  She does have a remote history of cardiac work-up with a normal echocardiogram.  Heart score 2 based on age, and risk factors.  Patient reports SOB however appears to be satting well on room air no increased work of breathing.  Low suspicion for cardiac source.  Wells score 0 making PE unlikely.  No signs of pharyngeal abscess or blood weeks angina.  There is a palpable goiter on the right side and associated tenderness particularly on left neck and clavicle with some soft  tissue swelling.  Will check CXR to evaluate for abnormalities.  CXR unremarkable.  Initial troponin negative along with unremarkable CBC and BMET.  Patient does have CT soft tissue neck with contrast ordered for November 15, given presentation and uncertain etiology, will proceed with obtaining imaging today to rule out other serious etiologies.   CT soft tissue neck without acute findings to explain patient's presentation.  Patient remained stable on room air.  Reviewed return precautions.  Discharged with plan to follow-up with endocrinology and primary care.  Final Clinical Impressions(s) / ED Diagnoses   Final diagnoses:  None    ED Discharge Orders    None       Las Ochenta Bing, DO 09/27/18 1214    Elnora Morrison, MD 09/28/18 [phone]

## 2018-09-27 NOTE — Discharge Instructions (Addendum)
It does not appear that your symptoms are related to your heart or your thyroid.  Please follow-up with your primary care physician and endocrinologist.

## 2018-09-27 NOTE — ED Notes (Signed)
ED Provider at bedside. 

## 2018-10-07 ENCOUNTER — Encounter (HOSPITAL_COMMUNITY): Payer: Self-pay

## 2018-10-07 ENCOUNTER — Ambulatory Visit (HOSPITAL_COMMUNITY): Payer: Commercial Managed Care - PPO

## 2018-10-07 ENCOUNTER — Other Ambulatory Visit (INDEPENDENT_AMBULATORY_CARE_PROVIDER_SITE_OTHER): Payer: Self-pay | Admitting: Internal Medicine

## 2018-10-07 ENCOUNTER — Ambulatory Visit (HOSPITAL_COMMUNITY)
Admission: RE | Admit: 2018-10-07 | Discharge: 2018-10-07 | Disposition: A | Discharge status: Home / Self Care | Payer: Commercial Managed Care - PPO | Source: Ambulatory Visit | Attending: Endocrinology | Admitting: Endocrinology

## 2019-01-13 ENCOUNTER — Telehealth: Payer: Self-pay | Admitting: *Deleted

## 2019-01-13 ENCOUNTER — Encounter: Payer: Self-pay | Admitting: Cardiology

## 2019-01-13 ENCOUNTER — Ambulatory Visit (INDEPENDENT_AMBULATORY_CARE_PROVIDER_SITE_OTHER): Payer: Commercial Managed Care - PPO | Admitting: Cardiology

## 2019-01-13 VITALS — BP 122/82 | HR 70 | Ht 65.0 in | Wt 246.0 lb

## 2019-01-13 DIAGNOSIS — I10 Essential (primary) hypertension: Secondary | ICD-10-CM

## 2019-01-13 DIAGNOSIS — R002 Palpitations: Secondary | ICD-10-CM | POA: Diagnosis not present

## 2019-01-13 MED ORDER — ATENOLOL 25 MG PO TABS: 37.5000 mg | ORAL_TABLET | Freq: Every day | ORAL | 2 refills | Status: DC

## 2019-01-13 NOTE — Telephone Encounter (Signed)
Patient said the monitor was returned to the company because of the location of her job and home and she didn't have a good signal to pick up the result.

## 2019-01-13 NOTE — Patient Instructions (Addendum)
Medication Instructions:   Your physician has recommended you make the following change in your medication:   Change atenolol to 1/&1/2 tablets (37.5 mg) daily as you have been doing already  Continue all other medications the same  Labwork:  NONE  Testing/Procedures:  NONE  Follow-Up:  Your physician recommends that you schedule a follow-up appointment in: as needed.  Any Other Special Instructions Will Be Listed Below (If Applicable).  If you need a refill on your cardiac medications before your next appointment, please call your pharmacy.

## 2019-01-13 NOTE — Progress Notes (Signed)
Cardiology Office Note  Date: 01/13/2019   ID: Jasmine Lawson, DOB 1961-08-27, MRN [id number]  PCP: Manon Hilding, MD  Primary Cardiologist: Rozann Lesches, MD   Chief Complaint  Patient presents with  . Follow-up palpitations    History of Present Illness: Jasmine Lawson is a 58 y.o. female seen in consultation back in June 2019 for evaluation of palpitations.  Echocardiogram at that time revealed normal LVEF at 60 to 65%.  Cardiac monitor was ordered at that time as well, however could not complete it due to poor transmission.  She presents for follow-up.  States that she has still had intermittent palpitations, although in the setting of adjustments in her thyroid medication regimen.  She just recently started on a new preparation within the last week.  She tends to feel palpitations mainly in the evenings.  She has been taking an extra 12.5 mg of atenolol and that has been helpful.  She does not report any exertional chest pain or syncope.  I personally reviewed her ECG today which showed sinus rhythm with low voltage.  Past Medical History:  Diagnosis Date  . Hypertension   . Hypothyroidism   . Interstitial cystitis   . Lumbar radiculopathy     Past Surgical History:  Procedure Laterality Date  . ABDOMINAL HYSTERECTOMY    . CESAREAN SECTION     x 2  . CHOLECYSTECTOMY     non-functioning by Dr. Lindalou Hose  . COLONOSCOPY N/A 04/12/2014   Procedure: COLONOSCOPY;  Surgeon: Rogene Houston, MD;  Location: [facility];  Service: Endoscopy;  Laterality: N/A;  225 - moved to [place] notified pt  . Fissure tear repair    . Rt rotator cuff repair      Current Outpatient Medications  Medication Sig Dispense Refill  . atenolol (TENORMIN) 25 MG tablet Take by mouth daily.    Marland Kitchen conjugated estrogens (PREMARIN) vaginal cream Place 1 Applicatorful vaginally. Patient states that she uses twice a week.    . dicyclomine (BENTYL) 10 MG capsule TAKE 1 CAPSULE BY MOUTH THREE TIMES DAILY  AS NEEDED FOR  SPASMS 60 capsule 2  . spironolactone (ALDACTONE) 25 MG tablet Take 25 mg by mouth daily.    Marland Kitchen thyroid (ARMOUR) 60 MG tablet Take 60 mg by mouth daily before breakfast.     No current facility-administered medications for this visit.    Allergies:  Codeine   Social History: The patient  reports that she has never smoked. She has never used smokeless tobacco. She reports that she does not drink alcohol or use drugs.  ROS:  Please see the history of present illness. Otherwise, complete review of systems is positive for none.  All other systems are reviewed and negative.   Physical Exam: VS:  BP 122/82   Pulse 70   Ht 5\' 5"  (1.651 m)   Wt 246 lb (111.6 kg)   SpO2 96%   BMI 40.94 kg/m , BMI Body mass index is 40.94 kg/m.  Wt Readings from Last 3 Encounters:  01/13/19 246 lb (111.6 kg)  09/27/18 234 lb (106.1 kg)  05/05/18 244 lb (110.7 kg)    General: Obese woman, appears comfortable at rest. HEENT: Conjunctiva and lids normal, oropharynx clear. Neck: Supple, no elevated JVP or carotid bruits, no thyromegaly. Lungs: Clear to auscultation, nonlabored breathing at rest. Cardiac: Regular rate and rhythm, no S3 or significant systolic murmur. Abdomen: Soft, nontender, bowel sounds present. Extremities: No pitting edema, distal pulses 2+.  ECG: I personally reviewed the tracing from 09/27/2018 which shows sinus rhythm with low voltage and nonspecific T wave changes.  Recent Labwork: 09/27/2018: BUN 10; Creatinine, Ser 0.60; Hemoglobin 13.2; Platelets 326; Potassium 3.6; Sodium 139   Other Studies Reviewed Today:  Echocardiogram 05/10/2018: Study Conclusions  - Left ventricle: The cavity size was normal. Systolic function was   normal. The estimated ejection fraction was in the range of 60%   to 65%. Wall motion was normal; there were no regional wall   motion abnormalities. Left ventricular diastolic function   parameters were normal. - Aortic valve: There was  trivial regurgitation. - Left atrium: The atrium was mildly dilated. - Atrial septum: No defect or patent foramen ovale was identified.  Assessment and Plan:  1.  Intermittent palpitations as described above.  She is undergoing medication adjustments for treatment of hypothyroidism, on a new preparation as of this week. Agree with continuing atenolol, can take 37.5 mg daily as this seems to be helping with her symptoms.  2.  Essential hypertension, on Aldactone and atenolol.  Blood pressure is well controlled today.  Current medicines were reviewed with the patient today.   Orders Placed This Encounter  Procedures  . EKG 12-Lead    Disposition: Follow-up as needed.  Signed, Satira Sark, MD, [facility] 01/13/2019 3:54 PM    [place] [facility] [address] [place], [place], [place] [address] Phone: [phone]; Fax: [phone]

## 2019-04-20 ENCOUNTER — Telehealth: Payer: Self-pay | Admitting: Cardiology

## 2019-04-20 DIAGNOSIS — R002 Palpitations: Secondary | ICD-10-CM

## 2019-04-20 NOTE — Telephone Encounter (Signed)
Per pt phone call-- having more of the heart palpitations more through out the day instead of just in the mornings and she's starting to feel bad and having them more often, around 3-4 times a week, also states she's had nausea w/ them.   Pt states she would like to try wearing the monitor again, maybe with the ZIO this time  Please give the pt call @ [phone]

## 2019-04-21 NOTE — Telephone Encounter (Signed)
Pt says palpitations several times daily that makes her "jittery" for about 30 mins - pt spoke with her pcp and thinks it may be coming from medications or thyroid - previous monitor couldn't record at pt work place - noticed more SOB - denies dizziness/swelling/weight gain - some tightness in her chest but not bothersome. Wanted to know if she needed to try another heart monitor. Says HR has remained in the 70s (doesn't know what BP has been)

## 2019-04-21 NOTE — Telephone Encounter (Signed)
Yes, we can obtain a 72-hour ZIO patch for evaluation of palpitations.

## 2019-04-21 NOTE — Telephone Encounter (Signed)
Pt agreeable to 72 hour monitor - will enroll Zio

## 2019-04-24 ENCOUNTER — Telehealth: Payer: Self-pay | Admitting: Cardiology

## 2019-04-24 NOTE — Telephone Encounter (Signed)
Pre-cert Verification for the following procedure    72 hour monitor - will enroll Zio

## 2019-05-15 ENCOUNTER — Ambulatory Visit: Payer: Commercial Managed Care - PPO

## 2019-05-15 ENCOUNTER — Ambulatory Visit (INDEPENDENT_AMBULATORY_CARE_PROVIDER_SITE_OTHER): Payer: Commercial Managed Care - PPO

## 2019-05-15 DIAGNOSIS — R002 Palpitations: Secondary | ICD-10-CM | POA: Diagnosis not present

## 2019-05-19 ENCOUNTER — Ambulatory Visit (INDEPENDENT_AMBULATORY_CARE_PROVIDER_SITE_OTHER): Payer: Commercial Managed Care - PPO | Admitting: Urology

## 2019-05-19 ENCOUNTER — Other Ambulatory Visit (HOSPITAL_COMMUNITY)
Admission: AD | Admit: 2019-05-19 | Discharge: 2019-05-19 | Disposition: A | Discharge status: Home / Self Care | Payer: Commercial Managed Care - PPO | Source: Other Acute Inpatient Hospital | Attending: Urology | Admitting: Urology

## 2019-05-19 DIAGNOSIS — R311 Benign essential microscopic hematuria: Secondary | ICD-10-CM | POA: Diagnosis present

## 2019-05-19 DIAGNOSIS — R3914 Feeling of incomplete bladder emptying: Secondary | ICD-10-CM

## 2019-05-19 DIAGNOSIS — R350 Frequency of micturition: Secondary | ICD-10-CM

## 2019-05-19 DIAGNOSIS — R102 Pelvic and perineal pain: Secondary | ICD-10-CM | POA: Diagnosis not present

## 2019-05-19 LAB — URINALYSIS, COMPLETE (UACMP) WITH MICROSCOPIC
Bilirubin Urine: NEGATIVE
Glucose, UA: NEGATIVE mg/dL
Ketones, ur: NEGATIVE mg/dL
Leukocytes,Ua: NEGATIVE
Nitrite: NEGATIVE
Protein, ur: NEGATIVE mg/dL
Specific Gravity, Urine: 1.011 (ref 1.005–1.030)
pH: 6 (ref 5.0–8.0)

## 2019-05-25 ENCOUNTER — Telehealth: Payer: Self-pay | Admitting: *Deleted

## 2019-05-25 ENCOUNTER — Other Ambulatory Visit: Payer: Self-pay | Admitting: *Deleted

## 2019-05-25 ENCOUNTER — Other Ambulatory Visit: Payer: Self-pay

## 2019-05-25 DIAGNOSIS — R002 Palpitations: Secondary | ICD-10-CM

## 2019-05-25 NOTE — Telephone Encounter (Signed)
Patient informed. Copy sent to PCP °

## 2019-05-25 NOTE — Telephone Encounter (Signed)
-----   Message from Satira Sark, MD sent at 05/25/2019 10:55 AM EDT ----- Results reviewed.  Monitor shows brief bursts of SVT and rare ectopy, she is likely experiencing these as palpitations.  Importantly, there were no sustained arrhythmias.  Continue with current plan and use of atenolol.

## 2019-08-05 IMAGING — CT CT NECK W/ CM
4 of 5 series · 15 of 33 positions shown, 17 images · IV contrast (omnipaque)
Comparison: None.

CLINICAL DATA: Palpable nodule or thyroid enlargement. Swelling
about the collarbone on the left for 1.5 months.

EXAM:
CT NECK WITH CONTRAST
TECHNIQUE: Multidetector CT imaging of the neck was performed using the
standard protocol following the bolus administration of intravenous
contrast.
CONTRAST:  75mL OMNIPAQUE IOHEXOL 300 MG/ML  SOLN

[Series 2: axial neck · axial · 0.51mm/px · z∈[-119,-21]mm · 3 of 123 slices shown]
[im 25/123  bone]
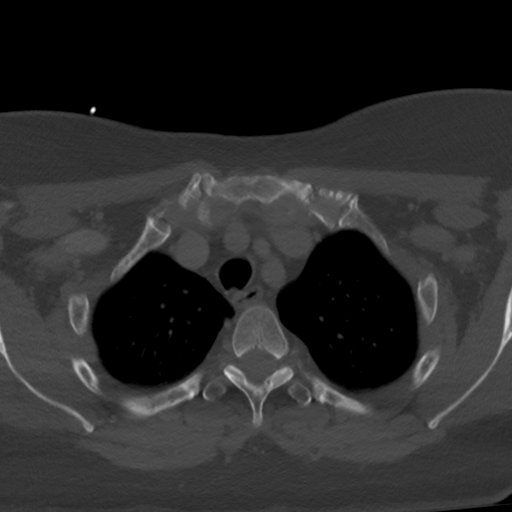
[im 49/123  bone]
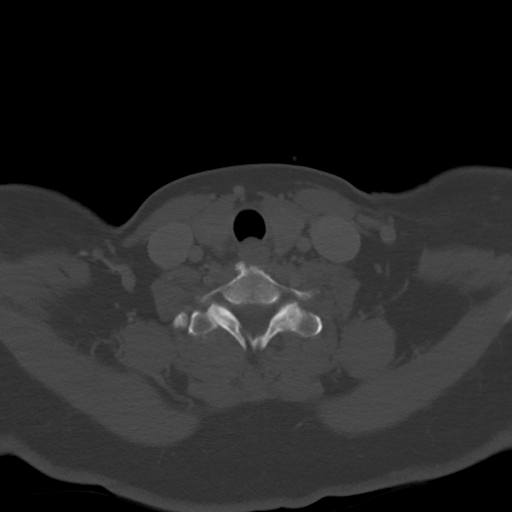
[im 74/123  bone]
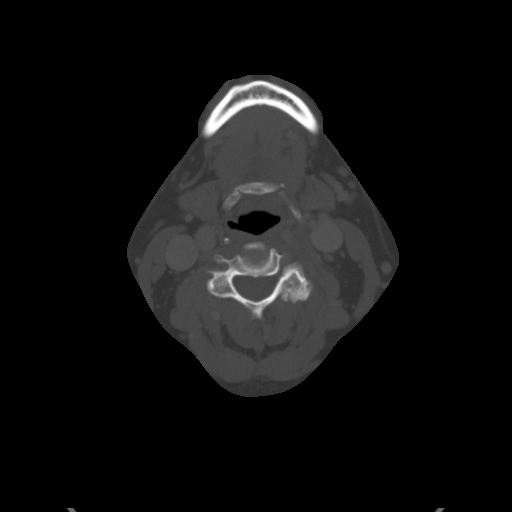

[Series 7: coronal neck · coronal · 0.48mm/px · 3 of 101 slices shown]
[im 21/101  bone]
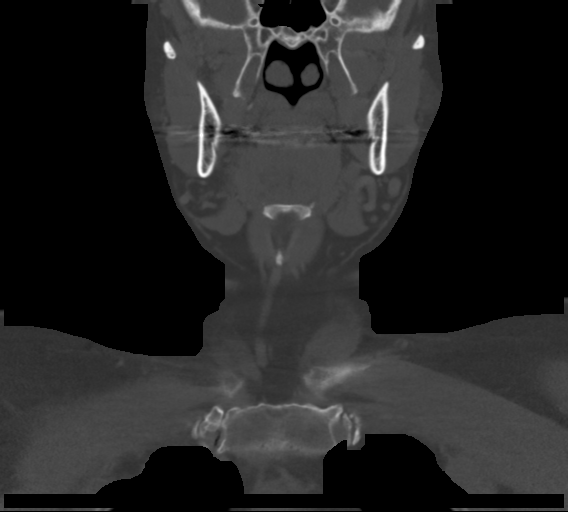
[im 41/101  bone]
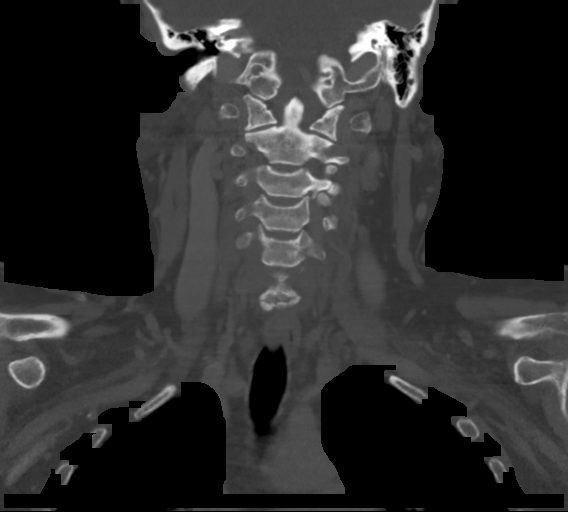
[im 61/101  bone]
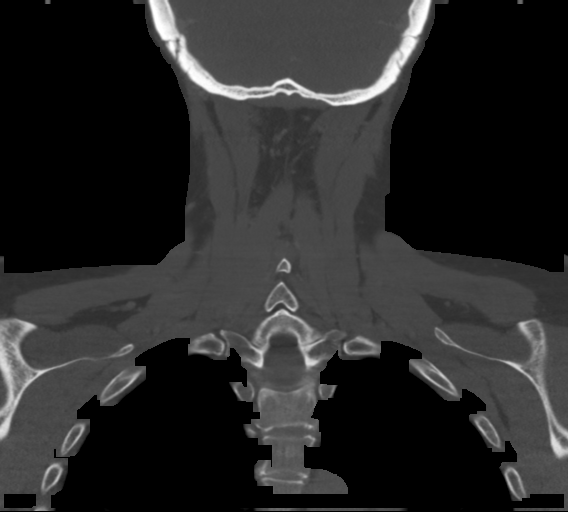

[Series 8: orthogonal ax · axial · 0.39mm/px · z∈[-159,+6]mm · 4 of 143 slices shown, 5 images]
[im 29/143  soft-tissue]
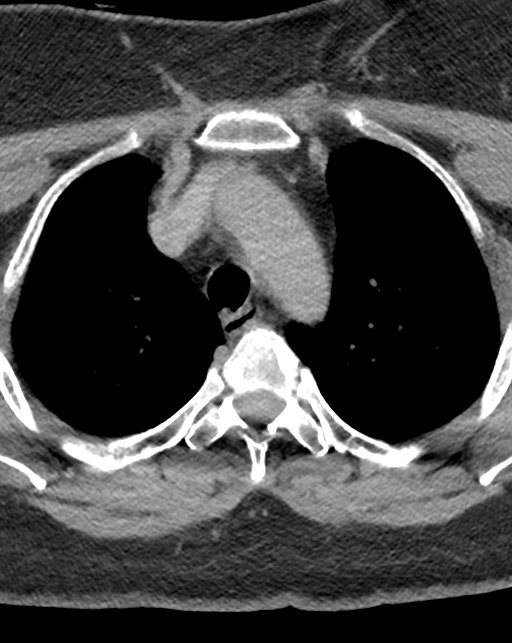
[im 29/143  bone]
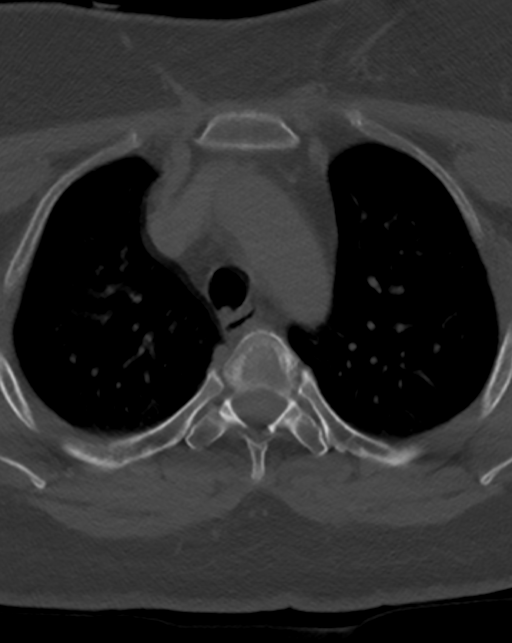
[im 57/143  bone]
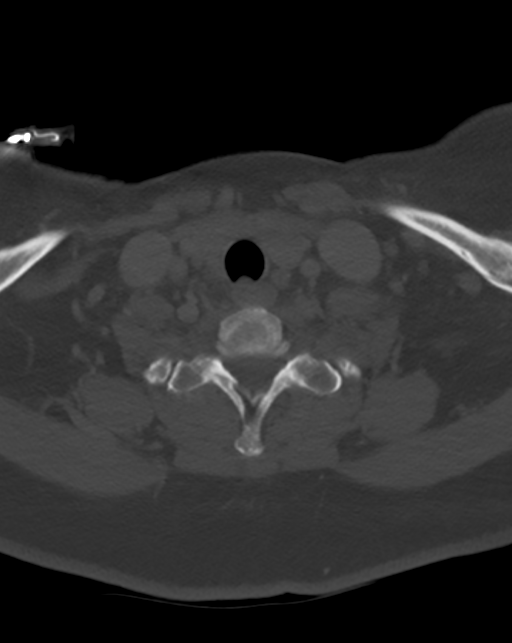
[im 86/143  bone]
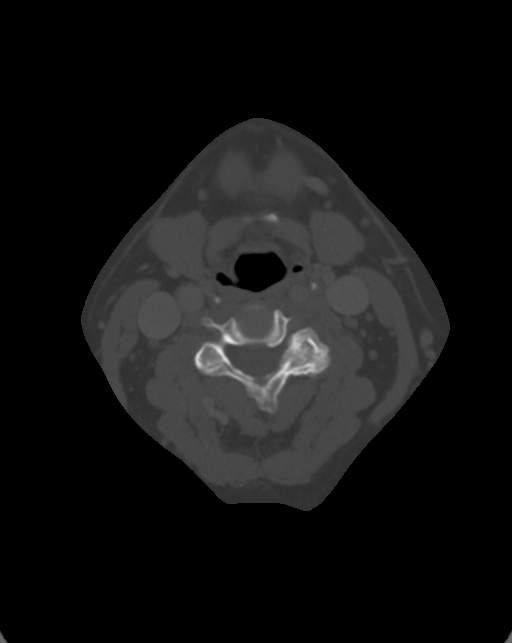
[im 114/143  bone]
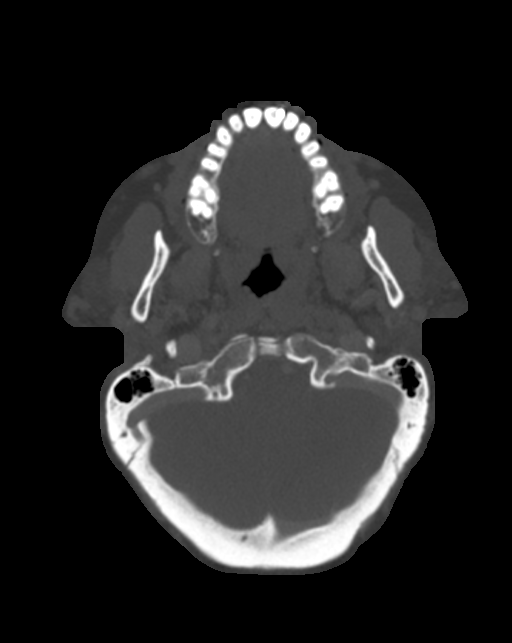

[Series 9: sagittal neck · sagittal · 0.48mm/px · 5 of 101 slices shown, 6 images]
[im 34/101  bone]
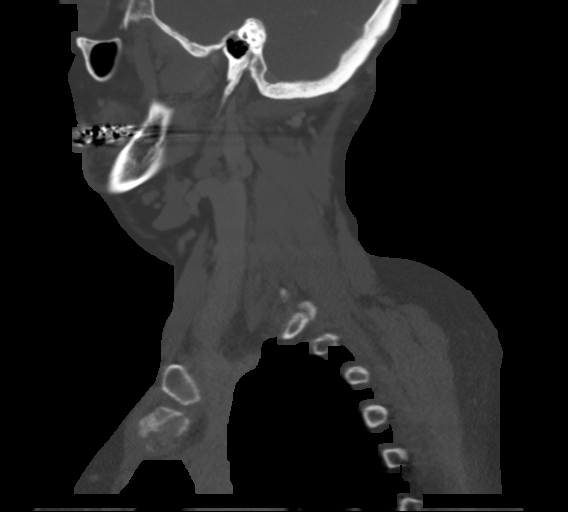
[im 42/101  bone]
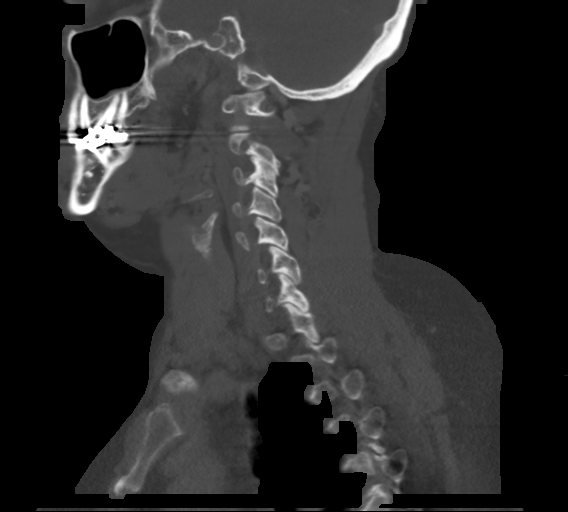
[im 51/101  soft-tissue]
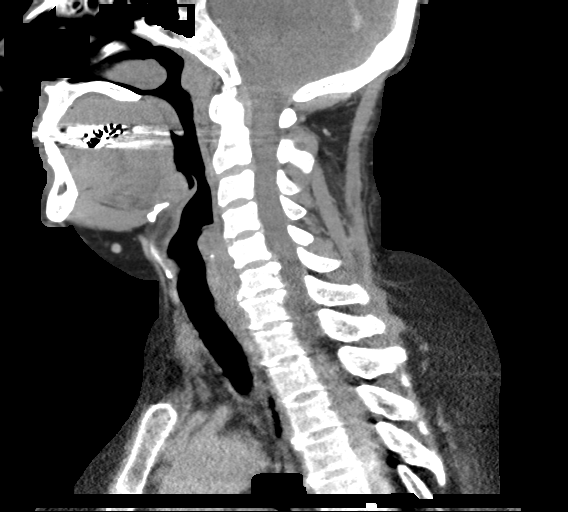
[im 51/101  bone]
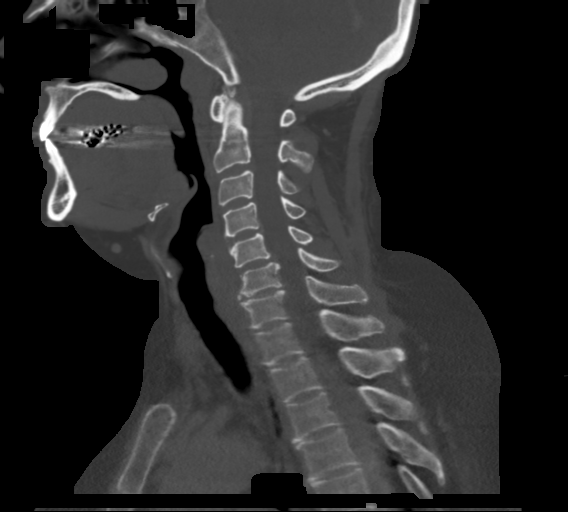
[im 59/101  bone]
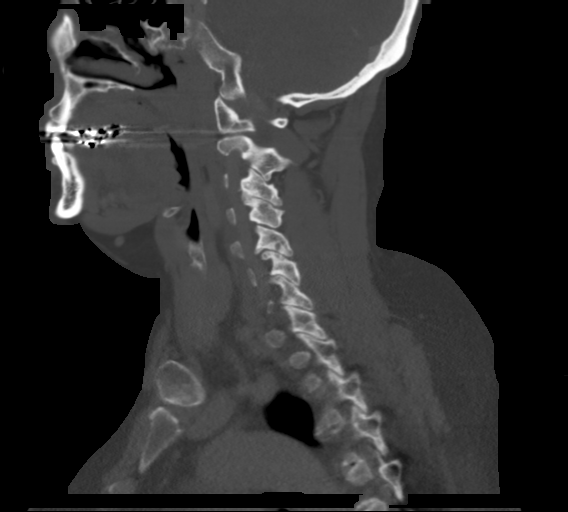
[im 67/101  bone]
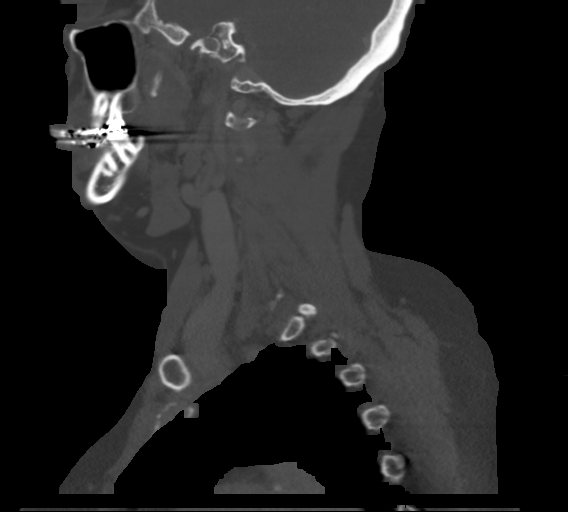

[15 of 33 positions shown; findings below may reference images not displayed]

FINDINGS: Pharynx and larynx: Normal. No mass or swelling.

Salivary glands: No inflammation, mass, or stone.

Thyroid: Normal.

Lymph nodes: None enlarged or abnormal density.

Vascular: Negative.

Limited intracranial: Negative

Visualized orbits: Negative

Mastoids and visualized paranasal sinuses: Clear

Skeleton: Degenerative facet spurring with C5-6 anterolisthesis that
is mild. No acute or aggressive finding

Upper chest: Palpable marker over the left upper chest wall with no
evidence of underlying mass or inflammation.
IMPRESSION: No explanation for palpable complaint. No mass, adenopathy, or
inflammation.

## 2020-02-10 ENCOUNTER — Other Ambulatory Visit: Payer: Self-pay | Admitting: Cardiology

## 2020-03-23 ENCOUNTER — Encounter (HOSPITAL_COMMUNITY): Payer: Self-pay

## 2020-03-23 ENCOUNTER — Other Ambulatory Visit: Payer: Self-pay

## 2020-03-23 ENCOUNTER — Emergency Department (HOSPITAL_COMMUNITY): Payer: Commercial Managed Care - PPO

## 2020-03-23 ENCOUNTER — Emergency Department (HOSPITAL_COMMUNITY)
Admission: EM | Admit: 2020-03-23 | Discharge: 2020-03-23 | Disposition: A | Discharge status: Home / Self Care | Payer: Commercial Managed Care - PPO | Attending: Emergency Medicine | Admitting: Emergency Medicine

## 2020-03-23 DIAGNOSIS — J0101 Acute recurrent maxillary sinusitis: Secondary | ICD-10-CM | POA: Diagnosis not present

## 2020-03-23 DIAGNOSIS — E039 Hypothyroidism, unspecified: Secondary | ICD-10-CM | POA: Insufficient documentation

## 2020-03-23 DIAGNOSIS — R0602 Shortness of breath: Secondary | ICD-10-CM | POA: Insufficient documentation

## 2020-03-23 DIAGNOSIS — I10 Essential (primary) hypertension: Secondary | ICD-10-CM | POA: Insufficient documentation

## 2020-03-23 LAB — CBC WITH DIFFERENTIAL/PLATELET
Abs Immature Granulocytes: 0.02 10*3/uL (ref 0.00–0.07)
Basophils Absolute: 0 10*3/uL (ref 0.0–0.1)
Basophils Relative: 1 %
Eosinophils Absolute: 0.2 10*3/uL (ref 0.0–0.5)
Eosinophils Relative: 2 %
HCT: 35.9 % — ABNORMAL LOW (ref 36.0–46.0)
Hemoglobin: 11.1 g/dL — ABNORMAL LOW (ref 12.0–15.0)
Immature Granulocytes: 0 %
Lymphocytes Relative: 24 %
Lymphs Abs: 1.9 10*3/uL (ref 0.7–4.0)
MCH: 25.3 pg — ABNORMAL LOW (ref 26.0–34.0)
MCHC: 30.9 g/dL (ref 30.0–36.0)
MCV: 81.8 fL (ref 80.0–100.0)
Monocytes Absolute: 0.8 10*3/uL (ref 0.1–1.0)
Monocytes Relative: 10 %
Neutro Abs: 5.2 10*3/uL (ref 1.7–7.7)
Neutrophils Relative %: 63 %
Platelets: 323 10*3/uL (ref 150–400)
RBC: 4.39 MIL/uL (ref 3.87–5.11)
RDW: 14.2 % (ref 11.5–15.5)
WBC: 8.3 10*3/uL (ref 4.0–10.5)
nRBC: 0 % (ref 0.0–0.2)

## 2020-03-23 LAB — COMPREHENSIVE METABOLIC PANEL
ALT: 12 U/L (ref 0–44)
AST: 15 U/L (ref 15–41)
Albumin: 3.9 g/dL (ref 3.5–5.0)
Alkaline Phosphatase: 60 U/L (ref 38–126)
Anion gap: 10 (ref 5–15)
BUN: 12 mg/dL (ref 6–20)
CO2: 25 mmol/L (ref 22–32)
Calcium: 8.9 mg/dL (ref 8.9–10.3)
Chloride: 104 mmol/L (ref 98–111)
Creatinine, Ser: 0.59 mg/dL (ref 0.44–1.00)
GFR calc Af Amer: >60 mL/min (ref >60)
GFR calc non Af Amer: >60 mL/min (ref >60)
Glucose, Bld: 108 mg/dL — ABNORMAL HIGH (ref 70–99)
Potassium: 3.4 mmol/L — ABNORMAL LOW (ref 3.5–5.1)
Sodium: 139 mmol/L (ref 135–145)
Total Bilirubin: 0.9 mg/dL (ref 0.3–1.2)
Total Protein: 7.1 g/dL (ref 6.5–8.1)

## 2020-03-23 LAB — TROPONIN I (HIGH SENSITIVITY): Troponin I (High Sensitivity): 4 ng/L (ref <18)

## 2020-03-23 LAB — BRAIN NATRIURETIC PEPTIDE: B Natriuretic Peptide: 65 pg/mL (ref 0.0–100.0)

## 2020-03-23 MED ORDER — DOXYCYCLINE HYCLATE 100 MG PO CAPS
100.0000 mg | ORAL_CAPSULE | Freq: Two times a day (BID) | ORAL | 0 refills | Status: DC
Start: 2020-03-23 — End: 2020-08-07

## 2020-03-23 MED ORDER — DOXYCYCLINE HYCLATE 100 MG PO TABS
100.0000 mg | ORAL_TABLET | Freq: Once | ORAL | Status: AC
  Administered 2020-03-23: 100 mg via ORAL
  Filled 2020-03-23: qty 1

## 2020-03-23 MED ORDER — LORATADINE 10 MG PO TABS
10.0000 mg | ORAL_TABLET | Freq: Once | ORAL | Status: AC
  Administered 2020-03-23: 10 mg via ORAL
  Filled 2020-03-23: qty 1

## 2020-03-23 MED ORDER — LORATADINE 10 MG PO TABS
10.0000 mg | ORAL_TABLET | Freq: Every day | ORAL | 0 refills | Status: DC
Start: 2020-03-23 — End: 2021-06-25

## 2020-03-23 NOTE — ED Provider Notes (Signed)
Emergency Department Provider Note   I have reviewed the triage vital signs and the nursing notes.   HISTORY  Chief Complaint Shortness of Breath   HPI Jasmine Lawson is a 59 y.o. female who presents the emerge department today secondary to shortness of breath.  Patient states that she has been having a sinus infection for the last few days but also had an episode yesterday where she had severe sharp pain that radiated from her chest to her back.  She states that this lasted a few minutes and slowly resolved.  She was asymptomatic from that and has no chest pain at this time.  She has had some right maxillary sinus pressure and discomfort for the last couple days.  Some likely postnasal drip.  Shortness of breath worse when laying flat.  No lower extremity swelling.  No palpitations.  No other associated symptoms.  No fever.   No other associated or modifying symptoms.    Past Medical History:  Diagnosis Date  . Hypertension   . Hypothyroidism   . Interstitial cystitis   . Lumbar radiculopathy     Patient Active Problem List   Diagnosis Date Noted  . Unspecified hypothyroidism 03/12/2014  . Essential hypertension, benign 03/12/2014  . Abdominal pain, left lower quadrant 03/12/2014  . Lt flank pain 03/12/2014    Past Surgical History:  Procedure Laterality Date  . ABDOMINAL HYSTERECTOMY    . CESAREAN SECTION     x 2  . CHOLECYSTECTOMY     non-functioning by Dr. Lindalou Hose  . COLONOSCOPY N/A 04/12/2014   Procedure: COLONOSCOPY;  Surgeon: Rogene Houston, MD;  Location: [facility];  Service: Endoscopy;  Laterality: N/A;  225 - moved to [place] notified pt  . Fissure tear repair    . Rt rotator cuff repair      Current Outpatient Rx  . Order #: [id number] Class: Normal  . Order #: [id number] Class: Historical Med  . Order #: [id number] Class: Normal  . Order #: [id number] Class: Print  . Order #: [id number] Class: Print  . Order #: [id number] Class: Historical Med  . Order  #: [id number] Class: Historical Med    Allergies Codeine  Family History  Problem Relation Age of Onset  . Heart failure Mother   . Deep vein thrombosis Mother   . CAD Father   . Colon cancer Neg Hx     Social History Social History   Tobacco Use  . Smoking status: Never Smoker  . Smokeless tobacco: Never Used  Substance Use Topics  . Alcohol use: No  . Drug use: No    Review of Systems  All other systems negative except as documented in the HPI. All pertinent positives and negatives as reviewed in the HPI. ____________________________________________   PHYSICAL EXAM:  VITAL SIGNS: ED Triage Vitals  Enc Vitals Group     BP 03/23/20 0530 (!) 147/84     Pulse Rate 03/23/20 0530 93     Resp 03/23/20 0530 19     Temp 03/23/20 0530 98.2 F (36.8 C)     Temp Source 03/23/20 0530 Oral     SpO2 03/23/20 0530 96 %     Weight 03/23/20 0524 238 lb (108 kg)     Height 03/23/20 0524 5\' 5"  (1.651 m)    Constitutional: Alert and oriented. Well appearing and in no acute distress. Eyes: Conjunctivae are normal. PERRL. EOMI. Head: Atraumatic. Nose: No congestion/rhinnorhea. Mouth/Throat: Mucous membranes are moist.  Oropharynx non-erythematous. Neck: No stridor.  No  meningeal signs.   Cardiovascular: Normal rate, regular rhythm. Good peripheral circulation. Grossly normal heart sounds.   Respiratory: Normal respiratory effort.  No retractions. Lungs CTAB. Gastrointestinal: Soft and nontender. No distention.  Musculoskeletal: No lower extremity tenderness nor edema. No gross deformities of extremities. Neurologic:  Normal speech and language. No gross focal neurologic deficits are appreciated.  Skin:  Skin is warm, dry and intact. No rash noted.  ____________________________________________   LABS (all labs ordered are listed, but only abnormal results are displayed)  Labs Reviewed  CBC WITH DIFFERENTIAL/PLATELET - Abnormal; Notable for the following components:       Result Value   Hemoglobin 11.1 (*)    HCT 35.9 (*)    MCH 25.3 (*)    All other components within normal limits  COMPREHENSIVE METABOLIC PANEL - Abnormal; Notable for the following components:   Potassium 3.4 (*)    Glucose, Bld 108 (*)    All other components within normal limits  BRAIN NATRIURETIC PEPTIDE  TROPONIN I (HIGH SENSITIVITY)   ____________________________________________  EKG   EKG Interpretation  Date/Time:  Saturday Mar 23 2020 05:50:10 EDT Ventricular Rate:  82 PR Interval:    QRS Duration: 103 QT Interval:  379 QTC Calculation: 443 R Axis:   40 Text Interpretation: Sinus rhythm Low voltage, precordial leads Borderline T abnormalities, anterior leads No significant change since last tracing Confirmed by Merrily Pew [phone]) on 03/23/2020 6:40:03 AM       ____________________________________________  RADIOLOGY  DG Chest Portable 1 View  Result Date: 03/23/2020 CLINICAL DATA:  Dyspnea, productive cough EXAM: PORTABLE CHEST 1 VIEW COMPARISON:  09/27/2018 chest radiograph. FINDINGS: Stable cardiomediastinal silhouette with normal heart size. No pneumothorax. No pleural effusion. Minimal linear scarring versus atelectasis in the peripheral mid to lower left lung. No pulmonary edema. No acute consolidative airspace disease. Soft tissue anchors overlie the right humeral head. IMPRESSION: Minimal linear scarring versus atelectasis in the peripheral mid to lower left lung. Otherwise no active cardiopulmonary disease. Electronically Signed   By: Ilona Sorrel M.D.   On: 03/23/2020 07:09    ____________________________________________  INITIAL IMPRESSION / ASSESSMENT AND PLAN / ED COURSE  Likely postnasal drip with sinusitis will need treated for antibiotics but there is also the possibility of the pain yesterday be in cardiac related and now having pulmonary edema so we will evaluate for that as well.  I think this is less likely though.  Would not do delta troponins  will just do one for cardiac assessment. If ok, home on abx.   Workup as above. Stable for discharge with antibiotics. Hemodynamically stable.   Pertinent labs & imaging results that were available during my care of the patient were reviewed by me and considered in my medical decision making (see chart for details).  A medical screening exam was performed and I feel the patient has had an appropriate workup for their chief complaint at this time and likelihood of emergent condition existing is low. They have been counseled on decision, discharge, follow up and which symptoms necessitate immediate return to the emergency department. They or their family verbally stated understanding and agreement with plan and discharged in stable condition.   ____________________________________________  FINAL CLINICAL IMPRESSION(S) / ED DIAGNOSES  Final diagnoses:  Shortness of breath  Acute recurrent maxillary sinusitis     MEDICATIONS GIVEN DURING THIS VISIT:  Medications  doxycycline (VIBRA-TABS) tablet 100 mg (100 mg Oral Given 03/23/20 0731)  loratadine (CLARITIN) tablet 10 mg (10 mg Oral Given  03/23/20 0731)     NEW OUTPATIENT MEDICATIONS STARTED DURING THIS VISIT:  Discharge Medication List as of 03/23/2020  7:15 AM    START taking these medications   Details  doxycycline (VIBRAMYCIN) 100 MG capsule Take 1 capsule (100 mg total) by mouth 2 (two) times daily. One po bid x 7 days, Starting Sat 03/23/2020, Print    loratadine (CLARITIN) 10 MG tablet Take 1 tablet (10 mg total) by mouth daily for 7 days. One po daily x 5 days, Starting Sat 03/23/2020, Until Sat 03/30/2020, Print        Note:  This note was prepared with assistance of Dragon voice recognition software. Occasional wrong-word or sound-a-like substitutions may have occurred due to the inherent limitations of voice recognition software.   Rudy Luhmann, Corene Cornea, MD 03/24/20 [phone]

## 2020-03-23 NOTE — ED Triage Notes (Signed)
Pt reports sinus drainage, productive cough (light yellow), shortness of breath and chest pressure. Pt has hx of allergies and is taking Rx allergy pills daily. Two nights ago pt reports sharp pains between her shoulder blades, when pt got up and moved around the pain subsided. Denies back pain at this time.

## 2020-05-07 ENCOUNTER — Other Ambulatory Visit: Payer: Self-pay | Admitting: *Deleted

## 2020-05-07 MED ORDER — ATENOLOL 25 MG PO TABS: ORAL_TABLET | ORAL | 0 refills | Status: DC

## 2020-07-05 ENCOUNTER — Other Ambulatory Visit (HOSPITAL_COMMUNITY): Payer: Self-pay | Admitting: Endocrinology

## 2020-07-05 ENCOUNTER — Telehealth: Payer: Self-pay | Admitting: Cardiology

## 2020-07-05 ENCOUNTER — Other Ambulatory Visit: Payer: Self-pay | Admitting: Endocrinology

## 2020-07-05 DIAGNOSIS — R0609 Other forms of dyspnea: Secondary | ICD-10-CM

## 2020-07-05 DIAGNOSIS — E049 Nontoxic goiter, unspecified: Secondary | ICD-10-CM

## 2020-07-05 DIAGNOSIS — R0789 Other chest pain: Secondary | ICD-10-CM

## 2020-07-05 NOTE — Telephone Encounter (Signed)
When I spoke with patient earlier I told her if symptoms worsen she should go to ED for evaluation

## 2020-07-05 NOTE — Telephone Encounter (Signed)
     If she has been experiencing intermittent symptoms over the past few months, would recommend keeping her appointment in 07/2020. Her back pain seems atypical for angina but given her dyspnea on exertion, he might consider a stress test pending evaluation. If she experiences progressive symptoms, would recommend Emergency Department evaluation in the interm. Will leave in Dr. Myles Gip inbox for his review since she wanted his input on the timing of follow-up.   Signed, Erma Heritage, PA-C 07/05/2020, 4:14 PM Pager: [phone]

## 2020-07-05 NOTE — Telephone Encounter (Signed)
I spoke with patient.  She was seen in ED in May for shortness of breath and back pain. Treated for sinus infection.  She improved from sinus infection.  For the last 6 weeks she has been having similar shortness of breath and back pain.  No chest tightness.  Pain is in upper back and mainly occurs when she lies down.  Does have head of bed slightly elevated. Pain lasts a couple of minutes and cuts off her breathing.  Goes away on it's own.  Will sometimes radiate to left shoulder. Also sometimes has pain in the lower left side of her back. Shortness of breath that comes and goes.  Is related to activity.  She has seen PCP and discussed back pain and shortness of breath.  Was encouraged to see Dr Domenic Polite again.  This appointment has been scheduled for 08/07/20.  She will plan on keeping this appointment unless Dr Domenic Polite feels she needs to be seen sooner.

## 2020-07-05 NOTE — Telephone Encounter (Signed)
New message    Pt c/o Shortness Of Breath: STAT if SOB developed within the last 24 hours or pt is noticeably SOB on the phone  1. Are you currently SOB (can you hear that pt is SOB on the phone)? no  2. How long have you been experiencing SOB? 6 weeks   3. Are you SOB when sitting or when up moving around?  It comes and goes hasnt been happening all the time   4. Are you currently experiencing any other symptoms? She has been having pains in her upper back

## 2020-07-06 NOTE — Telephone Encounter (Signed)
Thank you for the update.  I reviewed the chart, our last encounter was in February 2020.  I reviewed the ER note from May as well.  I think we should go ahead and arrange an echocardiogram as well as a Lexiscan Myoview for evaluation of dyspnea on exertion and atypical chest pain - I will forward to [place] to arrange.  For now would keep the scheduled visit in September, if we need to move this up based on the above testing, this can be arranged.

## 2020-07-09 ENCOUNTER — Encounter: Payer: Self-pay | Admitting: *Deleted

## 2020-07-09 NOTE — Telephone Encounter (Signed)
Patient informed and verbalized understanding of plan. Lexiscan instructions read to patient on the phone and advised that she can stop by the office to pick up a copy.

## 2020-07-12 ENCOUNTER — Ambulatory Visit (HOSPITAL_COMMUNITY)
Admission: RE | Admit: 2020-07-12 | Discharge: 2020-07-12 | Disposition: A | Discharge status: Home / Self Care | Payer: Commercial Managed Care - PPO | Source: Ambulatory Visit | Attending: Endocrinology | Admitting: Endocrinology

## 2020-07-12 ENCOUNTER — Other Ambulatory Visit: Payer: Self-pay

## 2020-07-12 DIAGNOSIS — E049 Nontoxic goiter, unspecified: Secondary | ICD-10-CM | POA: Diagnosis present

## 2020-07-18 ENCOUNTER — Encounter (HOSPITAL_COMMUNITY)
Admission: RE | Admit: 2020-07-18 | Discharge: 2020-07-18 | Disposition: A | Discharge status: Home / Self Care | Payer: Commercial Managed Care - PPO | Source: Ambulatory Visit | Attending: Cardiology | Admitting: Cardiology

## 2020-07-18 ENCOUNTER — Ambulatory Visit (HOSPITAL_COMMUNITY)
Admission: RE | Admit: 2020-07-18 | Discharge: 2020-07-18 | Disposition: A | Discharge status: Home / Self Care | Payer: Commercial Managed Care - PPO | Source: Ambulatory Visit | Attending: Cardiology | Admitting: Cardiology

## 2020-07-18 ENCOUNTER — Encounter (HOSPITAL_COMMUNITY): Payer: Self-pay

## 2020-07-18 ENCOUNTER — Encounter (HOSPITAL_BASED_OUTPATIENT_CLINIC_OR_DEPARTMENT_OTHER)
Admission: RE | Admit: 2020-07-18 | Discharge: 2020-07-18 | Disposition: A | Discharge status: Home / Self Care | Payer: Commercial Managed Care - PPO | Source: Ambulatory Visit | Attending: Cardiology | Admitting: Cardiology

## 2020-07-18 ENCOUNTER — Other Ambulatory Visit: Payer: Self-pay

## 2020-07-18 DIAGNOSIS — R0600 Dyspnea, unspecified: Secondary | ICD-10-CM | POA: Diagnosis present

## 2020-07-18 DIAGNOSIS — R0789 Other chest pain: Secondary | ICD-10-CM

## 2020-07-18 DIAGNOSIS — R0609 Other forms of dyspnea: Secondary | ICD-10-CM

## 2020-07-18 LAB — ECHOCARDIOGRAM COMPLETE
AV Mean grad: 2.2 mmHg
AV Peak grad: 4.3 mmHg
Ao pk vel: 1.04 m/s
Area-P 1/2: 3.68 cm2
S' Lateral: 2.83 cm

## 2020-07-18 LAB — NM MYOCAR MULTI W/SPECT W/WALL MOTION / EF
LV dias vol: 92 mL (ref 46–106)
LV sys vol: 37 mL
Peak HR: 92 {beats}/min
RATE: 0.29
Rest HR: 65 {beats}/min
SDS: 1
SRS: 0
SSS: 1
TID: 1.02

## 2020-07-18 MED ORDER — TECHNETIUM TC 99M TETROFOSMIN IV KIT
30.0000 | PACK | Freq: Once | INTRAVENOUS | Status: AC
  Administered 2020-07-18: 31 via INTRAVENOUS

## 2020-07-18 MED ORDER — SODIUM CHLORIDE FLUSH 0.9 % IV SOLN
INTRAVENOUS | Status: AC
  Administered 2020-07-18: 10 mL via INTRAVENOUS
  Filled 2020-07-18: qty 10

## 2020-07-18 MED ORDER — TECHNETIUM TC 99M TETROFOSMIN IV KIT
10.0000 | PACK | Freq: Once | INTRAVENOUS | Status: AC | PRN
  Administered 2020-07-18: 11 via INTRAVENOUS

## 2020-07-18 MED ORDER — REGADENOSON 0.4 MG/5ML IV SOLN
INTRAVENOUS | Status: AC
  Administered 2020-07-18: 0.4 mg via INTRAVENOUS
  Filled 2020-07-18: qty 5

## 2020-07-18 NOTE — Progress Notes (Signed)
*  PRELIMINARY RESULTS* Echocardiogram 2D Echocardiogram has been performed.  Jasmine Lawson 07/18/2020, 9:14 AM

## 2020-07-19 ENCOUNTER — Telehealth: Payer: Self-pay | Admitting: *Deleted

## 2020-07-19 NOTE — Telephone Encounter (Signed)
-----   Message from Satira Sark, MD sent at 07/18/2020  1:20 PM EDT ----- Results reviewed. Follow-up echocardiogram shows low normal LVEF at 50 to 55%, normal RV contraction. No significant valvular abnormalities. Await stress test results. She is already scheduled for follow-up.

## 2020-07-19 NOTE — Telephone Encounter (Signed)
Patient informed. Copy sent to PCP °

## 2020-07-19 NOTE — Telephone Encounter (Signed)
-----   Message from Satira Sark, MD sent at 07/18/2020  5:09 PM EDT ----- Results reviewed.  Follow-up stress test was reassuring, no evidence of ischemia to suggest obstructive CAD.  Keep scheduled follow-up.

## 2020-08-01 ENCOUNTER — Other Ambulatory Visit: Payer: Self-pay | Admitting: Cardiology

## 2020-08-07 ENCOUNTER — Encounter: Payer: Self-pay | Admitting: Cardiology

## 2020-08-07 ENCOUNTER — Ambulatory Visit (INDEPENDENT_AMBULATORY_CARE_PROVIDER_SITE_OTHER): Payer: Commercial Managed Care - PPO | Admitting: Cardiology

## 2020-08-07 ENCOUNTER — Other Ambulatory Visit: Payer: Self-pay

## 2020-08-07 VITALS — BP 116/74 | HR 68 | Ht 65.0 in | Wt 248.0 lb

## 2020-08-07 DIAGNOSIS — I10 Essential (primary) hypertension: Secondary | ICD-10-CM | POA: Diagnosis not present

## 2020-08-07 DIAGNOSIS — R0602 Shortness of breath: Secondary | ICD-10-CM

## 2020-08-07 DIAGNOSIS — R002 Palpitations: Secondary | ICD-10-CM | POA: Diagnosis not present

## 2020-08-07 MED ORDER — ATENOLOL 25 MG PO TABS: 25.0000 mg | ORAL_TABLET | Freq: Every morning | ORAL | 1 refills | Status: DC

## 2020-08-07 NOTE — Patient Instructions (Signed)
Medication Instructions:   Your physician has recommended you make the following change in your medication:   Take 25 mg atenolol in the morning and 12.5 mg in the evening  Continue other medications the same  Labwork:  None  Testing/Procedures:  None  Follow-Up:  Your physician recommends that you schedule a follow-up appointment in: 6 months.  Any Other Special Instructions Will Be Listed Below (If Applicable).  If you need a refill on your cardiac medications before your next appointment, please call your pharmacy.

## 2020-08-07 NOTE — Progress Notes (Signed)
Cardiology Office Note  Date: 08/07/2020   ID: Jasmine Lawson, DOB [birth year], MRN [id number]  PCP:  Jasmine Hilding, MD  Cardiologist:  Jasmine Lesches, MD Electrophysiologist:  None   Chief Complaint  Patient presents with  . Cardiac follow-up    History of Present Illness: Jasmine Lawson is a 59 y.o. female last seen in February 2020.  She presents for a follow-up visit.  She had been experiencing shortness of breath as well as baseline intermittent palpitations.  We pursued follow-up cardiac structural and ischemic testing including a Lexiscan Myoview and echocardiogram, both of which were reassuring as noted below.  She tells me today that she is feeling better overall, still notices a sense of palpitations that is mainly in the evenings.  She feels like atenolol has been beneficial.  She is retiring on September 30 from the [employer], looking forward to spending more time with family, also increasing her exercise plan.  She would like to start walking regularly and doing yoga.  I reviewed her medications which are outlined below.  We did discuss taking her atenolol 25 mg in the morning and 12.5 mg in the evening.   Past Medical History:  Diagnosis Date  . Hypertension   . Hypothyroidism   . Interstitial cystitis   . Lumbar radiculopathy     Past Surgical History:  Procedure Laterality Date  . ABDOMINAL HYSTERECTOMY    . CESAREAN SECTION     x 2  . CHOLECYSTECTOMY     non-functioning by Dr. Lindalou Lawson  . COLONOSCOPY N/A 04/12/2014   Procedure: COLONOSCOPY;  Surgeon: Jasmine Houston, MD;  [facility];  Service: Endoscopy;  Laterality: N/A;  225 - moved to [place] notified pt  . Fissure tear repair    . Rt rotator cuff repair      Current Outpatient Medications  Medication Sig Dispense Refill  . albuterol (VENTOLIN HFA) 108 (90 Base) MCG/ACT inhaler Inhale 2 puffs into the lungs every 6 (six) hours as needed.    Marland Kitchen atenolol  (TENORMIN) 25 MG tablet Take 1 tablet (25 mg total) by mouth in the morning. & 1/2 tablet  (12.5 mg) in the evening 135 tablet 1  . cetirizine (ZYRTEC) 10 MG tablet Take 10 mg by mouth daily.    Marland Kitchen conjugated estrogens (PREMARIN) vaginal cream Place 1 Applicatorful vaginally. Patient states that she uses twice a week.    . dicyclomine (BENTYL) 10 MG capsule TAKE 1 CAPSULE BY MOUTH THREE TIMES DAILY AS NEEDED FOR  SPASMS 60 capsule 2  . famotidine (PEPCID AC) 10 MG tablet Take 10 mg by mouth daily.    Marland Kitchen levothyroxine (SYNTHROID) 75 MCG tablet Take 75 mcg by mouth daily.    Marland Kitchen loratadine (CLARITIN) 10 MG tablet Take 1 tablet (10 mg total) by mouth daily for 7 days. One po daily x 5 days 7 tablet 0  . spironolactone (ALDACTONE) 25 MG tablet Take 12.5 mg by mouth daily.      No current facility-administered medications for this visit.   Allergies:  Codeine   ROS:   No syncope.  Physical Exam: VS:  BP 116/74   Pulse 68   Ht 5\' 5"  (1.651 m)   Wt 248 lb (112.5 kg)   SpO2 98%   BMI 41.27 kg/m , BMI Body mass index is 41.27 kg/m.  Wt Readings from Last 3 Encounters:  08/07/20 248 lb (112.5 kg)  03/23/20 238 lb (108  kg)  01/13/19 246 lb (111.6 kg)    General: Patient appears comfortable at rest. HEENT: Conjunctiva and lids normal, wearing a mask. Lungs: Clear to auscultation, nonlabored breathing at rest. Cardiac: Regular rate and rhythm, no S3 or significant systolic murmur, no pericardial rub. Extremities: No pitting edema.  ECG:  An ECG dated 03/23/2020 was personally reviewed today and demonstrated:  Sinus rhythm with borderline low voltage and nonspecific T wave changes.  Recent Labwork: 03/23/2020: ALT 12; AST 15; B Natriuretic Peptide 65.0; BUN 12; Creatinine, Ser 0.59; Hemoglobin 11.1; Platelets 323; Potassium 3.4; Sodium 139   Other Studies Reviewed Today:  Chest x-ray 03/23/2020: FINDINGS: Stable cardiomediastinal silhouette with normal heart size. No pneumothorax. No pleural  effusion. Minimal linear scarring versus atelectasis in the peripheral mid to lower left lung. No pulmonary edema. No acute consolidative airspace disease. Soft tissue anchors overlie the right humeral head.  IMPRESSION: Minimal linear scarring versus atelectasis in the peripheral mid to lower left lung. Otherwise no active cardiopulmonary disease.  Echocardiogram 07/18/2020: 1. Left ventricular ejection fraction, by estimation, is 50 to 55%. The  left ventricle has low normal function. The left ventricle has no regional  wall motion abnormalities. There is mild left ventricular hypertrophy.  Left ventricular diastolic  parameters are indeterminate.  2. Right ventricular systolic function is normal. The right ventricular  size is normal.  3. Left atrial size was mildly dilated.  4. The mitral valve is normal in structure. No evidence of mitral valve  regurgitation. No evidence of mitral stenosis.  5. The aortic valve was not well visualized. Aortic valve regurgitation  is not visualized. No aortic stenosis is present.  6. The inferior vena cava is normal in size with greater than 50%  respiratory variability, suggesting right atrial pressure of 3 mmHg.   Lexiscan Myoview 07/18/2020:  There was no ST segment deviation noted during stress.  The study is normal. There are no perfusion defects consistent with prior infarct or current ischemia.  This is a low risk study.  The left ventricular ejection fraction is normal (55-65%).  Assessment and Plan:  1.  Interval shortness of breath, improved at this point.  Recent echocardiogram and Lexiscan Myoview were reassuring as detailed above, would continue with observation and general risk factor modification at this time.  She continues to follow at [facility].  2.  Palpitations, stable and generally improved on atenolol.  She will continue atenolol at 25 mg in the morning with 12.5 mg dose in the evening.  3.  Essential  hypertension, blood pressure well controlled today.  She is also on Aldactone.  No changes were made today.  Medication Adjustments/Labs and Tests Ordered: Current medicines are reviewed at length with the patient today.  Concerns regarding medicines are outlined above.   Tests Ordered: Orders Placed This Encounter  Procedures  . EKG 12-Lead    Medication Changes: Meds ordered this encounter  Medications  . atenolol (TENORMIN) 25 MG tablet    Sig: Take 1 tablet (25 mg total) by mouth in the morning. & 1/2 tablet  (12.5 mg) in the evening    Dispense:  135 tablet    Refill:  1    08/07/2020 change in directions    Disposition:  Follow up 6 months in the [place] office.  Signed, Satira Sark, MD, [facility] 08/07/2020 8:40 AM    [place] at [place], [place], [place] [address] Phone: [phone]; Fax: [phone]

## 2020-10-25 ENCOUNTER — Other Ambulatory Visit (INDEPENDENT_AMBULATORY_CARE_PROVIDER_SITE_OTHER): Payer: Self-pay | Admitting: *Deleted

## 2020-10-25 MED ORDER — DICYCLOMINE HCL 10 MG PO CAPS: 10.0000 mg | ORAL_CAPSULE | Freq: Two times a day (BID) | ORAL | 0 refills | Status: DC | PRN

## 2020-11-21 ENCOUNTER — Telehealth (INDEPENDENT_AMBULATORY_CARE_PROVIDER_SITE_OTHER): Payer: Self-pay | Admitting: *Deleted

## 2020-11-21 NOTE — Telephone Encounter (Signed)
If she is having diarrhea she may have colitis/infection. Antibiotic should take care of both. He can take OTC Imodium for diarrhea no more than 6 mg/day in divided dose. She will need to be seen if she is not better by next week.

## 2020-11-21 NOTE — Telephone Encounter (Signed)
I have talked with Mrs. Jasmine Lawson. She was at her PCP office at this time. Dr.Sasser states that the patient is tender on the left side. Patient reports that she has had diarrhea since early Tuesday Morning. On Monday night she states that she had no fever and still does not , but on Monday she felt sick and as if she was going to get a virus. She has also ate peanuts prior to this.  Dr.Sasser told patient that he felt that she was having a diverticulitis flare. He is going to start the patient on Cipro and Flagyl. If she does not feel better in 2 weeks she is to let him know  and also let her GI physician know. He instructed her to eat popsicles , and drink fluids.  I told Mrs.Jasmine Lawson that  I would make Dr.Rehman aware and if he has any further recommendations we would call her back.

## 2020-11-21 NOTE — Telephone Encounter (Signed)
Left voice message (this is Charm Barges mom)   Last few days has had some issues with her colon and afraid she may end up in ED.  Wants to know if we can see her or something.  Please call and triage her --she is a patient here last seen 08/2017  IBS diarrhea

## 2020-11-21 NOTE — Telephone Encounter (Signed)
Patient was called and made aware. 

## 2020-11-23 DIAGNOSIS — U071 COVID-19: Secondary | ICD-10-CM

## 2020-11-23 HISTORY — DX: COVID-19: U07.1

## 2021-02-10 ENCOUNTER — Ambulatory Visit (INDEPENDENT_AMBULATORY_CARE_PROVIDER_SITE_OTHER): Payer: Commercial Managed Care - PPO | Admitting: Cardiology

## 2021-02-10 ENCOUNTER — Encounter: Payer: Self-pay | Admitting: Cardiology

## 2021-02-10 VITALS — BP 126/80 | HR 71 | Wt 245.0 lb

## 2021-02-10 DIAGNOSIS — R002 Palpitations: Secondary | ICD-10-CM

## 2021-02-10 DIAGNOSIS — I10 Essential (primary) hypertension: Secondary | ICD-10-CM

## 2021-02-10 NOTE — Progress Notes (Signed)
Cardiology Office Note  Date: 02/10/2021   ID: Jasmine Lawson, DOB [birth year], MRN [id number]  PCP:  Manon Hilding, MD  Cardiologist:  Rozann Lesches, MD Electrophysiologist:  None   Chief Complaint  Patient presents with  . Cardiac follow-up    History of Present Illness: Jasmine Lawson is a 60 y.o. female last seen in September 2021.  She presents for a routine visit.  Has been enjoying retirement, taking care of a grandchild at this time.  She does report occasional palpitations but feels like they are better since we increased atenolol.  Cardiac testing from August of last year is noted below.  I reviewed her medications.  She continues to follow with [facility] for primary care.  Past Medical History:  Diagnosis Date  . Hypertension   . Hypothyroidism   . Interstitial cystitis   . Lumbar radiculopathy     Past Surgical History:  Procedure Laterality Date  . ABDOMINAL HYSTERECTOMY    . CESAREAN SECTION     x 2  . CHOLECYSTECTOMY     non-functioning by Dr. Lindalou Hose  . COLONOSCOPY N/A 04/12/2014   Procedure: COLONOSCOPY;  Surgeon: Rogene Houston, MD;  [facility];  Service: Endoscopy;  Laterality: N/A;  225 - moved to [place] notified pt  . Fissure tear repair    . Rt rotator cuff repair      Current Outpatient Medications  Medication Sig Dispense Refill  . albuterol (VENTOLIN HFA) 108 (90 Base) MCG/ACT inhaler Inhale 2 puffs into the lungs every 6 (six) hours as needed.    Marland Kitchen atenolol (TENORMIN) 25 MG tablet Take 1 tablet (25 mg total) by mouth in the morning. & 1/2 tablet  (12.5 mg) in the evening 135 tablet 1  . cetirizine (ZYRTEC) 10 MG tablet Take 10 mg by mouth daily.    Marland Kitchen conjugated estrogens (PREMARIN) vaginal cream Place 1 Applicatorful vaginally. Patient states that she uses twice a week.    . dicyclomine (BENTYL) 10 MG capsule Take 1 capsule (10 mg total) by mouth 2 (two) times daily as needed for spasms. Patient will need to make a 3 month  appointment with [facility] before any more refills or she may get from her PCP. Last seen in our office in 2019. 90 capsule 0  . famotidine (PEPCID) 10 MG tablet Take 10 mg by mouth daily.    Marland Kitchen levothyroxine (SYNTHROID) 75 MCG tablet Take 75 mcg by mouth daily.    Marland Kitchen spironolactone (ALDACTONE) 25 MG tablet Take 12.5 mg by mouth daily.     Marland Kitchen loratadine (CLARITIN) 10 MG tablet Take 1 tablet (10 mg total) by mouth daily for 7 days. One po daily x 5 days 7 tablet 0   No current facility-administered medications for this visit.   Allergies:  Codeine   ROS: No syncope.  Physical Exam: VS:  BP 126/80 (BP Location: Right Arm, Patient Position: Sitting, Cuff Size: Large)   Pulse 71   Wt 245 lb (111.1 kg)   SpO2 96%   BMI 40.77 kg/m , BMI Body mass index is 40.77 kg/m.  Wt Readings from Last 3 Encounters:  02/10/21 245 lb (111.1 kg)  08/07/20 248 lb (112.5 kg)  03/23/20 238 lb (108 kg)    General: Patient appears comfortable at rest. HEENT: Conjunctiva and lids normal, wearing a mask. Neck: Supple, no elevated JVP or carotid bruits, no thyromegaly. Lungs: Clear to auscultation, nonlabored breathing at rest. Cardiac: Regular rate  and rhythm, no S3 or significant systolic murmur, no pericardial rub. Extremities: No pitting edema.  ECG:  An ECG dated 08/07/2020 was personally reviewed today and demonstrated:  Sinus rhythm with lead artifact, nonspecific T wave changes, lead loss in III.Marland Kitchen  Recent Labwork: 03/23/2020: ALT 12; AST 15; B Natriuretic Peptide 65.0; BUN 12; Creatinine, Ser 0.59; Hemoglobin 11.1; Platelets 323; Potassium 3.4; Sodium 139   Other Studies Reviewed Today:  Echocardiogram 07/18/2020: 1. Left ventricular ejection fraction, by estimation, is 50 to 55%. The  left ventricle has low normal function. The left ventricle has no regional  wall motion abnormalities. There is mild left ventricular hypertrophy.  Left ventricular diastolic  parameters are indeterminate.  2.  Right ventricular systolic function is normal. The right ventricular  size is normal.  3. Left atrial size was mildly dilated.  4. The mitral valve is normal in structure. No evidence of mitral valve  regurgitation. No evidence of mitral stenosis.  5. The aortic valve was not well visualized. Aortic valve regurgitation  is not visualized. No aortic stenosis is present.  6. The inferior vena cava is normal in size with greater than 50%  respiratory variability, suggesting right atrial pressure of 3 mmHg.   Lexiscan Myoview 07/18/2020:  There was no ST segment deviation noted during stress.  The study is normal. There are no perfusion defects consistent with prior infarct or current ischemia.  This is a low risk study.  The left ventricular ejection fraction is normal (55-65%).  Assessment and Plan:  1.  Palpitations, generally better since increasing atenolol dose.  No changes were made today.  2.  Essential hypertension, blood pressure is well controlled today.  She is on Aldactone along with atenolol.  Keep follow-up with [facility].  Medication Adjustments/Labs and Tests Ordered: Current medicines are reviewed at length with the patient today.  Concerns regarding medicines are outlined above.   Tests Ordered: No orders of the defined types were placed in this encounter.   Medication Changes: No orders of the defined types were placed in this encounter.   Disposition:  Follow up 1 year in the [place] office.  Signed, Satira Sark, MD, [facility] 02/10/2021 4:20 PM    [place] at [place], [place], [place] [address] Phone: [phone]; Fax: [phone]

## 2021-02-10 NOTE — Patient Instructions (Signed)

## 2021-05-02 ENCOUNTER — Other Ambulatory Visit: Payer: Self-pay

## 2021-05-17 ENCOUNTER — Other Ambulatory Visit: Payer: Self-pay | Admitting: Cardiology

## 2021-05-20 IMAGING — US US THYROID
1 series · 13 of 25 positions shown · non-contrast
Comparison: None.

CLINICAL DATA: 58-year-old female with a history of dysphagia and
thyroid disease

EXAM:
THYROID ULTRASOUND
TECHNIQUE: Ultrasound examination of the thyroid gland and adjacent soft
tissues was performed.

[Series 1: us thyroid · 0.07mm/px · 13 of 55 slices shown]
[im 1/55]
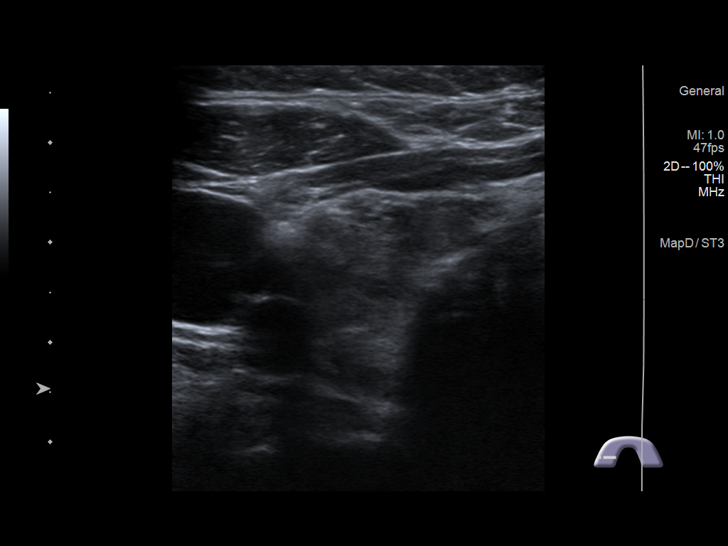
[im 5/55]
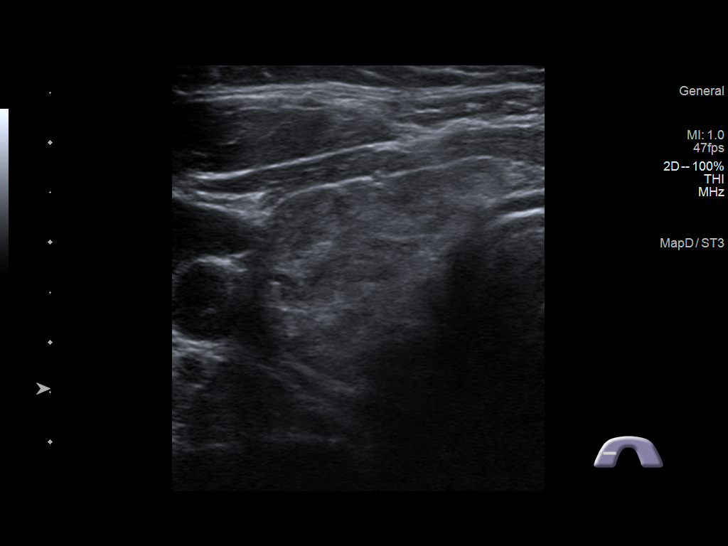
[im 10/55]
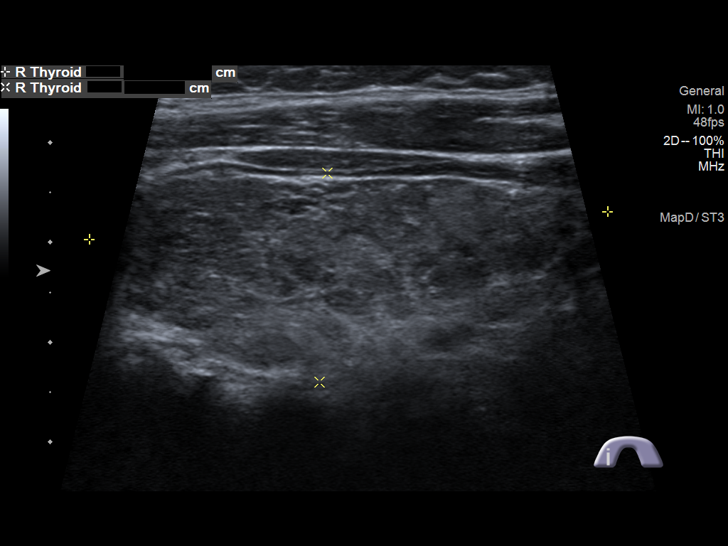
[im 14/55]
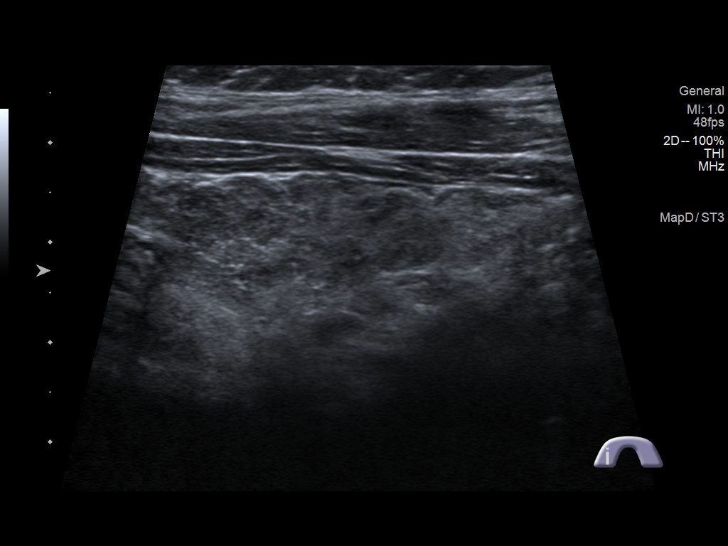
[im 19/55]
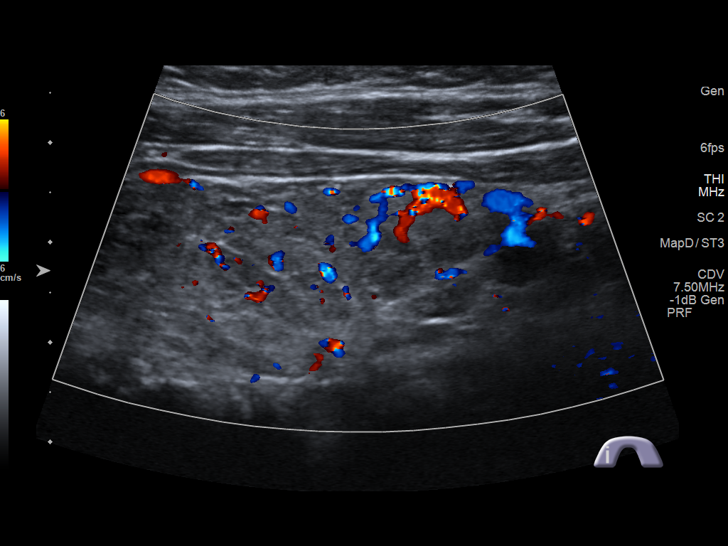
[im 23/55]
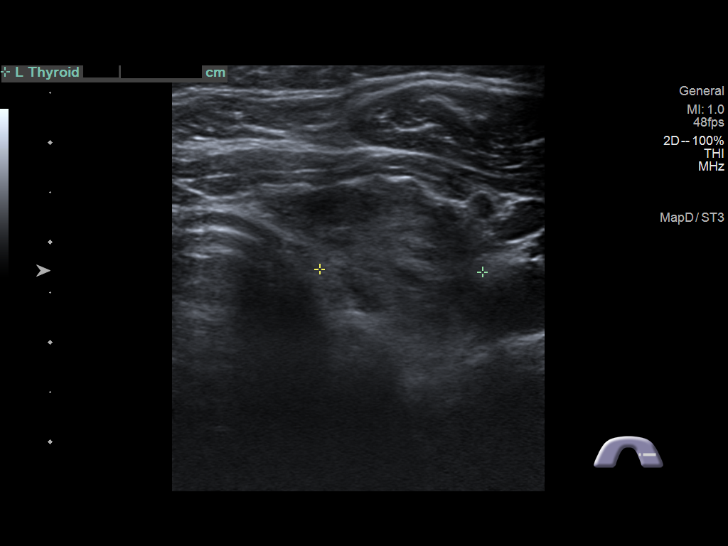
[im 28/55]
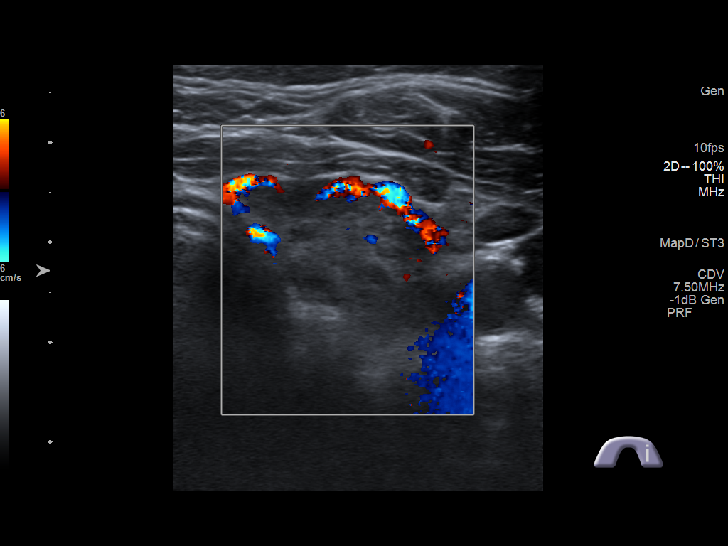
[im 32/55]
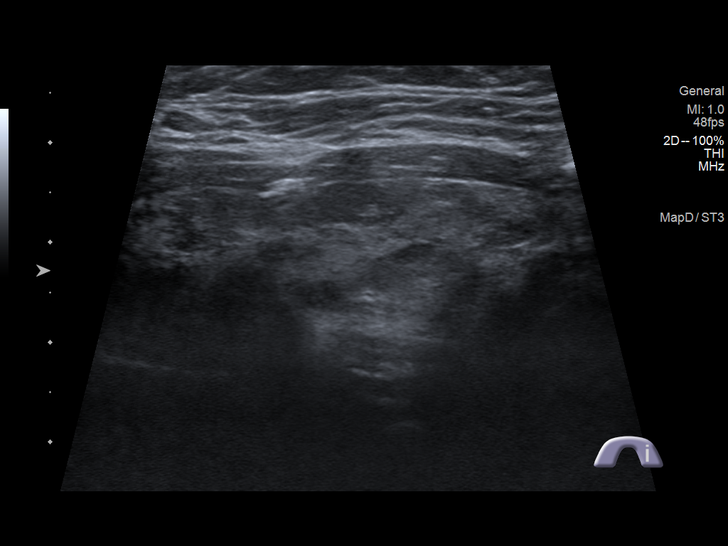
[im 37/55]
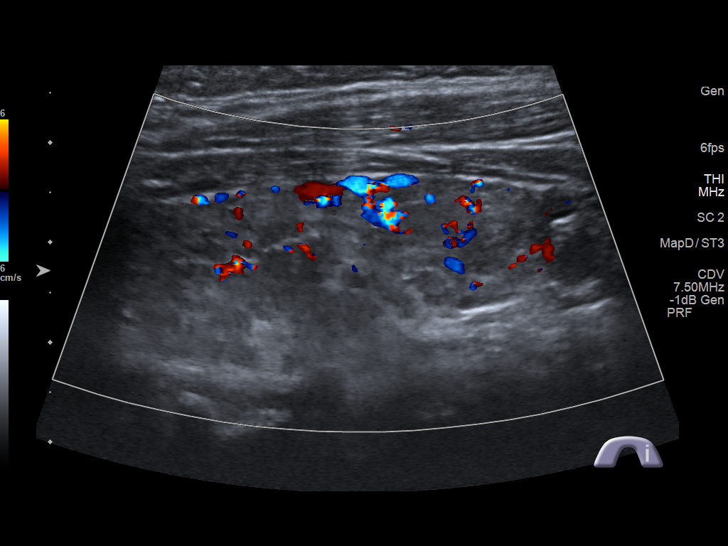
[im 41/55]
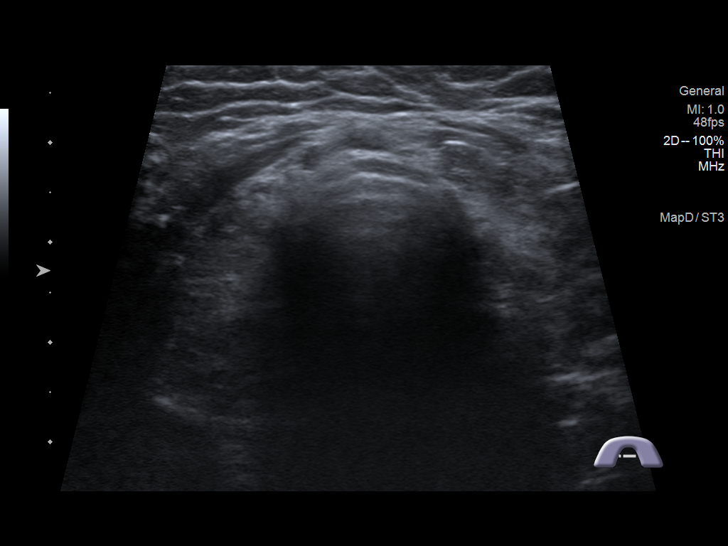
[im 46/55]
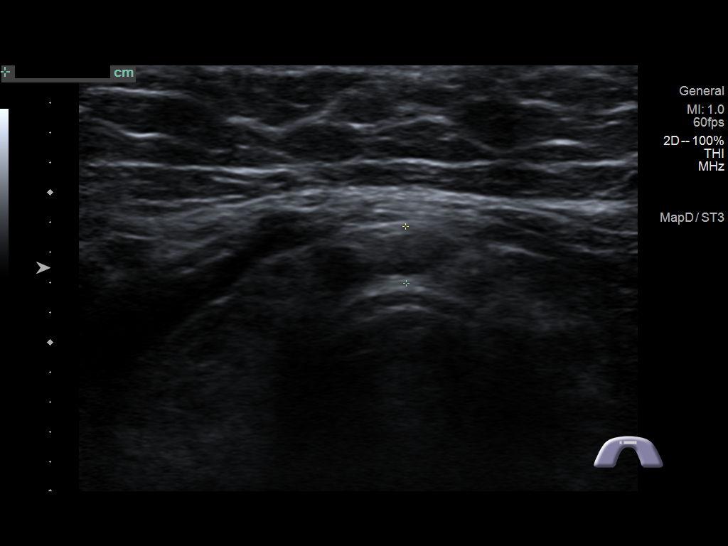
[im 50/55]
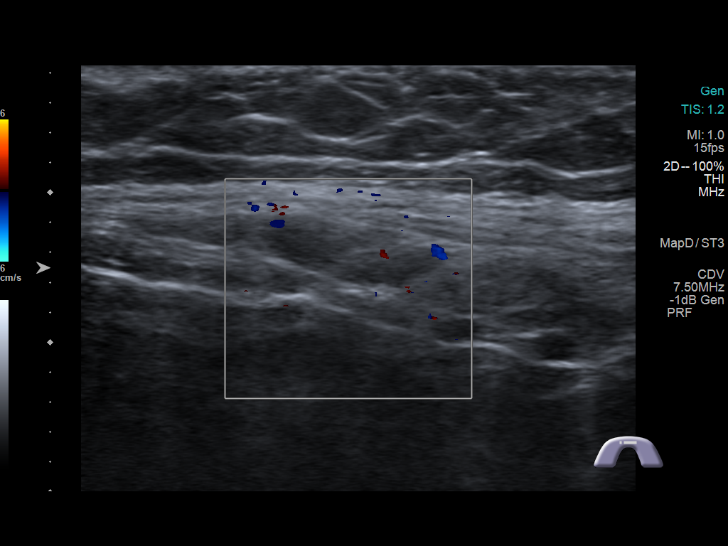
[im 55/55]
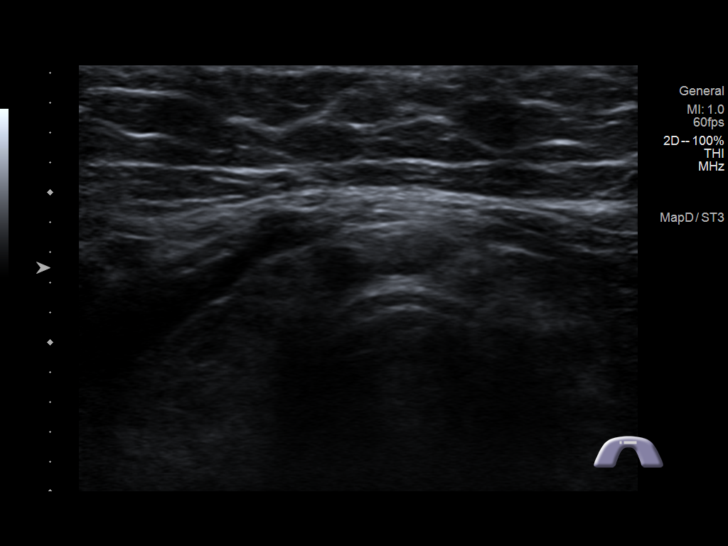

[13 of 25 positions shown; findings below may reference images not displayed]

FINDINGS: Parenchymal Echotexture: Markedly heterogenous

Isthmus: 0.4 cm

Right lobe: 5.2 cm x 2.1 cm x 2.0 cm

Left lobe: 5.2 cm x 2.2 cm x 1.6 cm

_________________________________________________________

Estimated total number of nodules >/= 1 cm: 0

Number of spongiform nodules >/=  2 cm not described below (TR1): 0

Number of mixed cystic and solid nodules >/= 1.5 cm not described
below (TR2): 0

_________________________________________________________

Nodule # 1:

Location: Isthmus; mid

Maximum size: 0.8 cm; Other 2 dimensions: 0.4 cm x 0.8 cm

Composition: cannot determine (2)

Echogenicity: hypoechoic (2)

Shape: not taller-than-wide (0)

Margins: smooth (0)

Echogenic foci: none (0)

ACR TI-RADS total points: 4.

ACR TI-RADS risk category: TR4 (4-6 points).

ACR TI-RADS recommendations:

Nodule does not meet criteria for surveillance or biopsy

_________________________________________________________

No adenopathy
IMPRESSION: Heterogeneous thyroid compatible with medical thyroid disease.

No thyroid nodule meets criteria for biopsy or surveillance, as
designated by the newly established ACR TI-RADS criteria.

Recommendations follow those established by the new ACR TI-RADS
criteria ([HOSPITAL] 6103;[DATE]).

## 2021-06-19 ENCOUNTER — Other Ambulatory Visit: Payer: Self-pay

## 2021-06-19 ENCOUNTER — Encounter: Payer: Self-pay | Admitting: Urology

## 2021-06-19 ENCOUNTER — Ambulatory Visit (INDEPENDENT_AMBULATORY_CARE_PROVIDER_SITE_OTHER): Payer: Commercial Managed Care - PPO | Admitting: Urology

## 2021-06-19 VITALS — BP 165/76 | HR 80 | Temp 98.1°F | Wt 240.2 lb

## 2021-06-19 DIAGNOSIS — R350 Frequency of micturition: Secondary | ICD-10-CM

## 2021-06-19 DIAGNOSIS — R102 Pelvic and perineal pain: Secondary | ICD-10-CM

## 2021-06-19 DIAGNOSIS — R351 Nocturia: Secondary | ICD-10-CM

## 2021-06-19 DIAGNOSIS — R310 Gross hematuria: Secondary | ICD-10-CM | POA: Diagnosis not present

## 2021-06-19 NOTE — Progress Notes (Signed)
Subjective: 1. Gross hematuria   2. Urinary frequency   3. Nocturia   4. Suprapubic pain     Beloved returns today in f/u.  She was last seen in 2020 and has a history of microhematuria with a negative w/u in 2016.  She was to return for cystoscopy in 2020 but didn't follow up.   She also has a history of vaginal atrophy with stenosis.  She has had some recurrent gross hematuria and has suprapubic pain with painful urgency.  She has frequency q1hr and nocturia x 2.  She has some post void dribbling but no incontinence.  She has some chronic intermittent left flank pain.  She has had no clots and the urine can be pink.   She has some dysuria.  She was last on an antibiotic about 2 weeks ago.   She had a negative cytology in June and her cultures have been negative.  She has held the topical estrogen since this has been going on.  She had a CT stone study in June that was negative.    ROS:  ROS  Allergies  Allergen Reactions   Codeine     Vomiting.    Past Medical History:  Diagnosis Date   Hypertension    Hypothyroidism    Interstitial cystitis    Lumbar radiculopathy     Past Surgical History:  Procedure Laterality Date   ABDOMINAL HYSTERECTOMY     CESAREAN SECTION     x 2   CHOLECYSTECTOMY     non-functioning by Dr. Lindalou Hose   COLONOSCOPY N/A 04/12/2014   Procedure: COLONOSCOPY;  Surgeon: Rogene Houston, MD;  [facility];  Service: Endoscopy;  Laterality: N/A;  225 - moved to [place] - [place] notified pt   Fissure tear repair     Rt rotator cuff repair      Social History   Socioeconomic History   Marital status: Married    Spouse name: Not on file   Number of children: Not on file   Years of education: Not on file   Highest education level: Not on file  Occupational History   Not on file  Tobacco Use   Smoking status: Never   Smokeless tobacco: Never  Vaping Use   Vaping Use: Never used  Substance and Sexual Activity   Alcohol use: No   Drug use: No    Sexual activity: Yes    Birth control/protection: None  Other Topics Concern   Not on file  Social History Narrative   Not on file   Social Determinants of Health   Financial Resource Strain: Not on file  Food Insecurity: Not on file  Transportation Needs: Not on file  Physical Activity: Not on file  Stress: Not on file  Social Connections: Not on file  Intimate Partner Violence: Not on file    Family History  Problem Relation Age of Onset   Heart failure Mother    Deep vein thrombosis Mother    CAD Father    Colon cancer Neg Hx     Anti-infectives: Anti-infectives (From admission, onward)    None       Current Outpatient Medications  Medication Sig Dispense Refill   albuterol (VENTOLIN HFA) 108 (90 Base) MCG/ACT inhaler Inhale 2 puffs into the lungs every 6 (six) hours as needed.     atenolol (TENORMIN) 25 MG tablet TAKE 1 & 1/2 (ONE & ONE-HALF) TABLETS BY MOUTH ONCE DAILY 135 tablet 2   cetirizine (ZYRTEC)  10 MG tablet Take 10 mg by mouth daily.     conjugated estrogens (PREMARIN) vaginal cream Place 1 Applicatorful vaginally. Patient states that she uses twice a week.     dicyclomine (BENTYL) 10 MG capsule Take 1 capsule (10 mg total) by mouth 2 (two) times daily as needed for spasms. Patient will need to make a 3 month appointment with [facility] before any more refills or she may get from her PCP. Last seen in our office in 2019. (Patient not taking: Reported on 06/19/2021) 90 capsule 0   famotidine (PEPCID) 10 MG tablet Take 10 mg by mouth daily.     levothyroxine (SYNTHROID) 75 MCG tablet Take 75 mcg by mouth daily.     loratadine (CLARITIN) 10 MG tablet Take 1 tablet (10 mg total) by mouth daily for 7 days. One po daily x 5 days 7 tablet 0   spironolactone (ALDACTONE) 25 MG tablet Take 12.5 mg by mouth daily.      No current facility-administered medications for this visit.     Objective: Vital signs in last 24 hours: BP (!) 165/76   Pulse 80   Temp  98.1 F (36.7 C)   Wt 240 lb 3.2 oz (109 kg)   BMI 39.97 kg/m   Intake/Output from previous day: No intake/output data recorded. Intake/Output this shift: '@IOTHISSHIFT'$ @   Physical Exam Constitutional:      Appearance: Normal appearance. She is obese.  Abdominal:     Palpations: Abdomen is soft.     Tenderness: There is abdominal tenderness (suprapubic and left flank).  Genitourinary:    Comments: Nl Ext genitalia. 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 Normal meatus with minimal urethral mobility but some tenderness. Bladder is slightly tender. She is s/p hysterectomy.  Musculoskeletal:        General: No swelling or tenderness. Normal range of motion.  Skin:    General: Skin is warm and dry.  Neurological:     General: No focal deficit present.     Mental Status: She is alert and oriented to person, place, and time.  Psychiatric:        Mood and Affect: Mood normal.        Behavior: Behavior normal.    Lab Results:  No results found for this or any previous visit (from the past 24 hour(s)).  BMET No results for input(s): NA, K, CL, CO2, GLUCOSE, BUN, CREATININE, CALCIUM in the last 72 hours. PT/INR No results for input(s): LABPROT, INR in the last 72 hours. ABG No results for input(s): PHART, HCO3 in the last 72 hours.  Invalid input(s): PCO2, PO2   UA reviewed and she has microhematuria.    Studies/Results: Prior office notes reviewed.  CT stone study from [facility] reviewed.  Outside cytology reviewed.    Assessment/Plan: Painful bladder with hematuria and OAB.  She may have IC but with the hematuria she needs cystoscopy.   I have recommended that we do a cystoscopy with bil RTG's and an HOD with instillation of pyridium and marcaine in the OR.  I reviewed the risks of bleeding, infection, bladder injury, persistent pain, retention, thrombotic events and anesthetic complications.     No orders of the defined types were placed in  this encounter.    Orders Placed This Encounter  Procedures   Urine Culture   Urinalysis, Routine w reflex microscopic      Return for i will schedule surgery. .    CC: Consuello Masse MD.  Irine Seal 06/19/2021 [phone]

## 2021-06-19 NOTE — H&P (View-Only) (Signed)
Subjective: 1. Gross hematuria   2. Urinary frequency   3. Nocturia   4. Suprapubic pain     Jasmine Lawson returns today in f/u.  She was last seen in 2020 and has a history of microhematuria with a negative w/u in 2016.  She was to return for cystoscopy in 2020 but didn't follow up.   She also has a history of vaginal atrophy with stenosis.  She has had some recurrent gross hematuria and has suprapubic pain with painful urgency.  She has frequency q1hr and nocturia x 2.  She has some post void dribbling but no incontinence.  She has some chronic intermittent left flank pain.  She has had no clots and the urine can be pink.   She has some dysuria.  She was last on an antibiotic about 2 weeks ago.   She had a negative cytology in June and her cultures have been negative.  She has held the topical estrogen since this has been going on.  She had a CT stone study in June that was negative.    ROS:  ROS  Allergies  Allergen Reactions   Codeine     Vomiting.    Past Medical History:  Diagnosis Date   Hypertension    Hypothyroidism    Interstitial cystitis    Lumbar radiculopathy     Past Surgical History:  Procedure Laterality Date   ABDOMINAL HYSTERECTOMY     CESAREAN SECTION     x 2   CHOLECYSTECTOMY     non-functioning by Dr. Lindalou Hose   COLONOSCOPY N/A 04/12/2014   Procedure: COLONOSCOPY;  Surgeon: Rogene Houston, MD;  Location: [facility];  Service: Endoscopy;  Laterality: N/A;  225 - moved to 38 - Ann notified pt   Fissure tear repair     Rt rotator cuff repair      Social History   Socioeconomic History   Marital status: Married    Spouse name: Not on file   Number of children: Not on file   Years of education: Not on file   Highest education level: Not on file  Occupational History   Not on file  Tobacco Use   Smoking status: Never   Smokeless tobacco: Never  Vaping Use   Vaping Use: Never used  Substance and Sexual Activity   Alcohol use: No   Drug use: No    Sexual activity: Yes    Birth control/protection: None  Other Topics Concern   Not on file  Social History Narrative   Not on file   Social Determinants of Health   Financial Resource Strain: Not on file  Food Insecurity: Not on file  Transportation Needs: Not on file  Physical Activity: Not on file  Stress: Not on file  Social Connections: Not on file  Intimate Partner Violence: Not on file    Family History  Problem Relation Age of Onset   Heart failure Mother    Deep vein thrombosis Mother    CAD Father    Colon cancer Neg Hx     Anti-infectives: Anti-infectives (From admission, onward)    None       Current Outpatient Medications  Medication Sig Dispense Refill   albuterol (VENTOLIN HFA) 108 (90 Base) MCG/ACT inhaler Inhale 2 puffs into the lungs every 6 (six) hours as needed.     atenolol (TENORMIN) 25 MG tablet TAKE 1 & 1/2 (ONE & ONE-HALF) TABLETS BY MOUTH ONCE DAILY 135 tablet 2   cetirizine (ZYRTEC)  10 MG tablet Take 10 mg by mouth daily.     conjugated estrogens (PREMARIN) vaginal cream Place 1 Applicatorful vaginally. Patient states that she uses twice a week.     dicyclomine (BENTYL) 10 MG capsule Take 1 capsule (10 mg total) by mouth 2 (two) times daily as needed for spasms. Patient will need to make a 3 month appointment with [facility] before any more refills or she may get from her PCP. Last seen in our office in 2019. (Patient not taking: Reported on 06/19/2021) 90 capsule 0   famotidine (PEPCID) 10 MG tablet Take 10 mg by mouth daily.     levothyroxine (SYNTHROID) 75 MCG tablet Take 75 mcg by mouth daily.     loratadine (CLARITIN) 10 MG tablet Take 1 tablet (10 mg total) by mouth daily for 7 days. One po daily x 5 days 7 tablet 0   spironolactone (ALDACTONE) 25 MG tablet Take 12.5 mg by mouth daily.      No current facility-administered medications for this visit.     Objective: Vital signs in last 24 hours: BP (!) 165/76   Pulse 80   Temp  98.1 F (36.7 C)   Wt 240 lb 3.2 oz (109 kg)   BMI 39.97 kg/m   Intake/Output from previous day: No intake/output data recorded. Intake/Output this shift: '@IOTHISSHIFT'$ @   Physical Exam Constitutional:      Appearance: Normal appearance. She is obese.  Abdominal:     Palpations: Abdomen is soft.     Tenderness: There is abdominal tenderness (suprapubic and left flank).  Genitourinary:    Comments: Nl Ext genitalia. 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 Normal meatus with minimal urethral mobility but some tenderness. Bladder is slightly tender. She is s/p hysterectomy.  Musculoskeletal:        General: No swelling or tenderness. Normal range of motion.  Skin:    General: Skin is warm and dry.  Neurological:     General: No focal deficit present.     Mental Status: She is alert and oriented to person, place, and time.  Psychiatric:        Mood and Affect: Mood normal.        Behavior: Behavior normal.    Lab Results:  No results found for this or any previous visit (from the past 24 hour(s)).  BMET No results for input(s): NA, K, CL, CO2, GLUCOSE, BUN, CREATININE, CALCIUM in the last 72 hours. PT/INR No results for input(s): LABPROT, INR in the last 72 hours. ABG No results for input(s): PHART, HCO3 in the last 72 hours.  Invalid input(s): PCO2, PO2   UA reviewed and she has microhematuria.    Studies/Results: Prior office notes reviewed.  CT stone study from [facility] reviewed.  Outside cytology reviewed.    Assessment/Plan: Painful bladder with hematuria and OAB.  She may have IC but with the hematuria she needs cystoscopy.   I have recommended that we do a cystoscopy with bil RTG's and an HOD with instillation of pyridium and marcaine in the OR.  I reviewed the risks of bleeding, infection, bladder injury, persistent pain, retention, thrombotic events and anesthetic complications.     No orders of the defined types were placed in  this encounter.    Orders Placed This Encounter  Procedures   Urine Culture   Urinalysis, Routine w reflex microscopic      Return for i will schedule surgery. .    CC: Consuello Masse MD.  Irine Seal 06/19/2021 [phone]

## 2021-06-20 LAB — URINALYSIS, ROUTINE W REFLEX MICROSCOPIC
Bilirubin, UA: NEGATIVE
Glucose, UA: NEGATIVE
Ketones, UA: NEGATIVE
Nitrite, UA: NEGATIVE
Protein,UA: NEGATIVE
Specific Gravity, UA: >1.03 — ABNORMAL HIGH (ref 1.005–1.030)
Urobilinogen, Ur: 0.2 mg/dL (ref 0.2–1.0)
pH, UA: 5.5 (ref 5.0–7.5)

## 2021-06-20 LAB — MICROSCOPIC EXAMINATION: Renal Epithel, UA: NONE SEEN /hpf

## 2021-06-21 LAB — URINE CULTURE

## 2021-06-23 ENCOUNTER — Telehealth: Payer: Self-pay

## 2021-06-23 NOTE — Progress Notes (Signed)
Results sent via my chart 

## 2021-06-23 NOTE — Telephone Encounter (Signed)
Opened in error

## 2021-06-24 ENCOUNTER — Other Ambulatory Visit: Payer: Self-pay | Admitting: Urology

## 2021-06-25 ENCOUNTER — Encounter (HOSPITAL_BASED_OUTPATIENT_CLINIC_OR_DEPARTMENT_OTHER): Payer: Self-pay | Admitting: Urology

## 2021-06-25 ENCOUNTER — Other Ambulatory Visit: Payer: Self-pay

## 2021-06-25 DIAGNOSIS — R319 Hematuria, unspecified: Secondary | ICD-10-CM

## 2021-06-25 DIAGNOSIS — M1990 Unspecified osteoarthritis, unspecified site: Secondary | ICD-10-CM

## 2021-06-25 DIAGNOSIS — N393 Stress incontinence (female) (male): Secondary | ICD-10-CM

## 2021-06-25 DIAGNOSIS — M5416 Radiculopathy, lumbar region: Secondary | ICD-10-CM

## 2021-06-25 DIAGNOSIS — R002 Palpitations: Secondary | ICD-10-CM

## 2021-06-25 DIAGNOSIS — I10 Essential (primary) hypertension: Secondary | ICD-10-CM

## 2021-06-25 DIAGNOSIS — Z973 Presence of spectacles and contact lenses: Secondary | ICD-10-CM

## 2021-06-25 DIAGNOSIS — E039 Hypothyroidism, unspecified: Secondary | ICD-10-CM

## 2021-06-25 HISTORY — DX: Presence of spectacles and contact lenses: Z97.3

## 2021-06-25 HISTORY — DX: Unspecified osteoarthritis, unspecified site: M19.90

## 2021-06-25 HISTORY — DX: Stress incontinence (female) (male): N39.3

## 2021-06-25 HISTORY — DX: Hematuria, unspecified: R31.9

## 2021-06-25 HISTORY — DX: Essential (primary) hypertension: I10

## 2021-06-25 HISTORY — DX: Palpitations: R00.2

## 2021-06-25 HISTORY — DX: Hypothyroidism, unspecified: E03.9

## 2021-06-25 HISTORY — DX: Radiculopathy, lumbar region: M54.16

## 2021-06-25 NOTE — Progress Notes (Signed)
Spoke w/ via phone for pre-op interview---pt Lab needs dos----    I stat COVID test -----patient states asymptomatic no test needed Arrive at -------730 am 07-01-2021 NPO after MN NO Solid Food.  Clear liquids from MN until---630 am then npo Med rec completed Medications to take morning of surgery -----atenolol, albuterol inhaler prn/bring inhaler, levothyroxine Diabetic medication -----n/a Patient instructed no nail polish to be worn day of surgery Patient instructed to bring photo id and insurance card day of surgery Patient aware to have Driver (ride ) / caregive spouse miker    for 24 hours after surgery  Patient Special Instructions -----none Pre-Op special Istructions -----none Patient verbalized understanding of instructions that were given at this phone interview. Patient denies shortness of breath, chest pain, fever, cough at this phone interview.   [facility] cardiology dr Domenic Polite (palpitaions) 02-10-2021 epic Echo 07-18-2020 epic Pershing Proud 07-18-2020 epic Ekg 08-07-2020 epic

## 2021-07-01 ENCOUNTER — Ambulatory Visit (HOSPITAL_BASED_OUTPATIENT_CLINIC_OR_DEPARTMENT_OTHER): Payer: Commercial Managed Care - PPO | Admitting: Anesthesiology

## 2021-07-01 ENCOUNTER — Ambulatory Visit (HOSPITAL_BASED_OUTPATIENT_CLINIC_OR_DEPARTMENT_OTHER)
Admission: RE | Admit: 2021-07-01 | Discharge: 2021-07-01 | Disposition: A | Discharge status: Home / Self Care | Payer: Commercial Managed Care - PPO | Attending: Urology | Admitting: Urology

## 2021-07-01 ENCOUNTER — Encounter (HOSPITAL_BASED_OUTPATIENT_CLINIC_OR_DEPARTMENT_OTHER)
Admission: RE | Disposition: A | Discharge status: Home / Self Care | Payer: Self-pay | Source: Home / Self Care | Attending: Urology

## 2021-07-01 ENCOUNTER — Other Ambulatory Visit: Payer: Self-pay

## 2021-07-01 ENCOUNTER — Encounter (HOSPITAL_BASED_OUTPATIENT_CLINIC_OR_DEPARTMENT_OTHER): Payer: Self-pay | Admitting: Urology

## 2021-07-01 DIAGNOSIS — Z79899 Other long term (current) drug therapy: Secondary | ICD-10-CM | POA: Diagnosis not present

## 2021-07-01 DIAGNOSIS — Z885 Allergy status to narcotic agent status: Secondary | ICD-10-CM | POA: Insufficient documentation

## 2021-07-01 DIAGNOSIS — N3011 Interstitial cystitis (chronic) with hematuria: Secondary | ICD-10-CM | POA: Insufficient documentation

## 2021-07-01 DIAGNOSIS — Z79890 Hormone replacement therapy (postmenopausal): Secondary | ICD-10-CM | POA: Insufficient documentation

## 2021-07-01 DIAGNOSIS — R3129 Other microscopic hematuria: Secondary | ICD-10-CM | POA: Diagnosis present

## 2021-07-01 HISTORY — DX: Other specified postprocedural states: Z98.890

## 2021-07-01 HISTORY — PX: CYSTO WITH HYDRODISTENSION: SHX5453

## 2021-07-01 HISTORY — DX: Other complications of anesthesia, initial encounter: T88.59XA

## 2021-07-01 HISTORY — DX: Other specified postprocedural states: R11.2

## 2021-07-01 LAB — POCT I-STAT, CHEM 8
BUN: 13 mg/dL (ref 6–20)
Calcium, Ion: 1.26 mmol/L (ref 1.15–1.40)
Chloride: 103 mmol/L (ref 98–111)
Creatinine, Ser: 0.6 mg/dL (ref 0.44–1.00)
Glucose, Bld: 105 mg/dL — ABNORMAL HIGH (ref 70–99)
HCT: 42 % (ref 36.0–46.0)
Hemoglobin: 14.3 g/dL (ref 12.0–15.0)
Potassium: 3.8 mmol/L (ref 3.5–5.1)
Sodium: 141 mmol/L (ref 135–145)
TCO2: 25 mmol/L (ref 22–32)

## 2021-07-01 SURGERY — CYSTOSCOPY, WITH BLADDER HYDRODISTENSION
Anesthesia: General | Site: Pelvis | Laterality: Bilateral

## 2021-07-01 MED ORDER — KETOROLAC TROMETHAMINE 30 MG/ML IJ SOLN
INTRAMUSCULAR | Status: AC
  Filled 2021-07-01: qty 1

## 2021-07-01 MED ORDER — ACETAMINOPHEN 10 MG/ML IV SOLN
INTRAVENOUS | Status: AC
  Filled 2021-07-01: qty 100

## 2021-07-01 MED ORDER — FENTANYL CITRATE (PF) 100 MCG/2ML IJ SOLN
INTRAMUSCULAR | Status: DC | PRN
  Administered 2021-07-01: 50 ug via INTRAVENOUS

## 2021-07-01 MED ORDER — FENTANYL CITRATE (PF) 100 MCG/2ML IJ SOLN: 25.0000 ug | INTRAMUSCULAR | Status: DC | PRN

## 2021-07-01 MED ORDER — CEFAZOLIN SODIUM-DEXTROSE 2-4 GM/100ML-% IV SOLN
INTRAVENOUS | Status: AC
  Filled 2021-07-01: qty 100

## 2021-07-01 MED ORDER — PROPOFOL 10 MG/ML IV BOLUS
INTRAVENOUS | Status: AC
  Filled 2021-07-01: qty 40

## 2021-07-01 MED ORDER — KETOROLAC TROMETHAMINE 30 MG/ML IJ SOLN
INTRAMUSCULAR | Status: DC | PRN
  Administered 2021-07-01: 30 mg via INTRAVENOUS

## 2021-07-01 MED ORDER — PHENAZOPYRIDINE HCL 200 MG PO TABS
ORAL | Status: DC | PRN
  Administered 2021-07-01: 15 mL via INTRAVESICAL

## 2021-07-01 MED ORDER — ARTIFICIAL TEARS OPHTHALMIC OINT
TOPICAL_OINTMENT | OPHTHALMIC | Status: AC
  Filled 2021-07-01: qty 3.5

## 2021-07-01 MED ORDER — ONDANSETRON HCL 4 MG/2ML IJ SOLN
INTRAMUSCULAR | Status: DC | PRN
  Administered 2021-07-01: 4 mg via INTRAVENOUS

## 2021-07-01 MED ORDER — LACTATED RINGERS IV SOLN: INTRAVENOUS | Status: DC

## 2021-07-01 MED ORDER — CEFAZOLIN SODIUM-DEXTROSE 2-4 GM/100ML-% IV SOLN
2.0000 g | INTRAVENOUS | Status: AC
  Administered 2021-07-01: 2 g via INTRAVENOUS

## 2021-07-01 MED ORDER — STERILE WATER FOR IRRIGATION IR SOLN
Status: DC | PRN
  Administered 2021-07-01: 1000 mL

## 2021-07-01 MED ORDER — SODIUM CHLORIDE 0.9% FLUSH: 3.0000 mL | Freq: Two times a day (BID) | INTRAVENOUS | Status: DC

## 2021-07-01 MED ORDER — LIDOCAINE HCL (PF) 2 % IJ SOLN
INTRAMUSCULAR | Status: AC
  Filled 2021-07-01: qty 5

## 2021-07-01 MED ORDER — FENTANYL CITRATE (PF) 100 MCG/2ML IJ SOLN
INTRAMUSCULAR | Status: AC
  Filled 2021-07-01: qty 2

## 2021-07-01 MED ORDER — IOHEXOL 300 MG/ML  SOLN
INTRAMUSCULAR | Status: DC | PRN
  Administered 2021-07-01: 9 mL via URETHRAL

## 2021-07-01 MED ORDER — LIDOCAINE 2% (20 MG/ML) 5 ML SYRINGE
INTRAMUSCULAR | Status: DC | PRN
  Administered 2021-07-01: 100 mg via INTRAVENOUS

## 2021-07-01 MED ORDER — ONDANSETRON HCL 4 MG/2ML IJ SOLN
INTRAMUSCULAR | Status: AC
  Filled 2021-07-01: qty 2

## 2021-07-01 MED ORDER — DEXAMETHASONE SODIUM PHOSPHATE 10 MG/ML IJ SOLN
INTRAMUSCULAR | Status: AC
  Filled 2021-07-01: qty 1

## 2021-07-01 MED ORDER — MIDAZOLAM HCL 2 MG/2ML IJ SOLN
INTRAMUSCULAR | Status: DC | PRN
  Administered 2021-07-01: 2 mg via INTRAVENOUS

## 2021-07-01 MED ORDER — HYDROCODONE-ACETAMINOPHEN 5-325 MG PO TABS: 1.0000 | ORAL_TABLET | Freq: Four times a day (QID) | ORAL | 0 refills | Status: DC | PRN

## 2021-07-01 MED ORDER — ACETAMINOPHEN 10 MG/ML IV SOLN
INTRAVENOUS | Status: DC | PRN
  Administered 2021-07-01: 1000 mg via INTRAVENOUS

## 2021-07-01 MED ORDER — MIDAZOLAM HCL 2 MG/2ML IJ SOLN
INTRAMUSCULAR | Status: AC
  Filled 2021-07-01: qty 2

## 2021-07-01 MED ORDER — DEXAMETHASONE SODIUM PHOSPHATE 10 MG/ML IJ SOLN
INTRAMUSCULAR | Status: DC | PRN
  Administered 2021-07-01: 10 mg via INTRAVENOUS

## 2021-07-01 MED ORDER — PROPOFOL 10 MG/ML IV BOLUS
INTRAVENOUS | Status: DC | PRN
  Administered 2021-07-01: 180 mg via INTRAVENOUS

## 2021-07-01 SURGICAL SUPPLY — 21 items
BAG DRAIN URO-CYSTO SKYTR STRL (DRAIN) ×2 IMPLANT
BAG DRN UROCATH (DRAIN) ×1
CATH ROBINSON RED A/P 16FR (CATHETERS) ×2 IMPLANT
CATH URET 5FR 28IN OPEN ENDED (CATHETERS) ×2 IMPLANT
CLOTH BEACON ORANGE TIMEOUT ST (SAFETY) ×2 IMPLANT
ELECT REM PT RETURN 9FT ADLT (ELECTROSURGICAL)
ELECTRODE REM PT RTRN 9FT ADLT (ELECTROSURGICAL) IMPLANT
GLOVE SURG POLYISO LF SZ8 (GLOVE) ×2 IMPLANT
GOWN STRL REUS W/TWL XL LVL3 (GOWN DISPOSABLE) ×2 IMPLANT
KIT TURNOVER CYSTO (KITS) ×2 IMPLANT
MANIFOLD NEPTUNE II (INSTRUMENTS) ×2 IMPLANT
NDL SAFETY ECLIPSE 18X1.5 (NEEDLE) ×1 IMPLANT
NEEDLE HYPO 18GX1.5 SHARP (NEEDLE) ×2
NS IRRIG 500ML POUR BTL (IV SOLUTION) IMPLANT
PACK CYSTO (CUSTOM PROCEDURE TRAY) ×2 IMPLANT
PLUG CATH AND CAP STER (CATHETERS) IMPLANT
SYR 20ML LL LF (SYRINGE) ×2 IMPLANT
SYR 30ML LL (SYRINGE) ×2 IMPLANT
TUBE CONNECTING 12X1/4 (SUCTIONS) ×2 IMPLANT
WATER STERILE IRR 1000ML POUR (IV SOLUTION) ×2 IMPLANT
WATER STERILE IRR 3000ML UROMA (IV SOLUTION) ×2 IMPLANT

## 2021-07-01 NOTE — Anesthesia Postprocedure Evaluation (Signed)
Anesthesia Post Note  Patient: Jasmine Lawson  Procedure(s) Performed: CYSTOSCOPY/HYDRODISTENSION INSTILLATION OF MARCAINE AND PYRIDIUM BILATERAL RETROGRADES (Bilateral: Pelvis)     Patient location during evaluation: PACU Anesthesia Type: General Level of consciousness: awake Pain management: pain level controlled Respiratory status: spontaneous breathing Cardiovascular status: stable Postop Assessment: no apparent nausea or vomiting Anesthetic complications: no   No notable events documented.  Last Vitals:  Vitals:   07/01/21 1020 07/01/21 1028  BP:    Pulse: 68 68  Resp: 16 19  Temp:  36.6 C  SpO2: 99% 95%    Last Pain:  Vitals:   07/01/21 0748  TempSrc: Oral  PainSc: 0-No pain                 Teletha Petrea

## 2021-07-01 NOTE — Discharge Instructions (Addendum)
CYSTOSCOPY HOME CARE INSTRUCTIONS  Activity: Rest for the remainder of the day.  Do not drive or operate equipment today.  You may resume normal activities in one to two days as instructed by your physician.   Meals: Drink plenty of liquids and eat light foods such as gelatin or soup this evening.  You may return to a normal meal plan tomorrow.  Return to Work: You may return to work in one to two days or as instructed by your physician.  Special Instructions / Symptoms: Call your physician if any of these symptoms occur:   -persistent or heavy bleeding  -bleeding which continues after first few urination  -large blood clots that are difficult to pass  -urine stream diminishes or stops completely  -fever equal to or higher than 101 degrees Farenheit.  -cloudy urine with a strong, foul odor  -severe pain  Females should always wipe from front to back after elimination.  You may feel some burning pain when you urinate.  This should disappear with time.  Applying moist heat to the lower abdomen or a hot tub bath may help relieve the pain. \   Patient Signature:  ________________________________________________________  Nurse's Signature:  ________________________________________________________ Post Anesthesia Home Care Instructions  Activity: Get plenty of rest for the remainder of the day. A responsible adult should stay with you for 24 hours following the procedure.  For the next 24 hours, DO NOT: -Drive a car -Operate machinery -Drink alcoholic beverages -Take any medication unless instructed by your physician -Make any legal decisions or sign important papers.  Meals: Start with liquid foods such as gelatin or soup. Progress to regular foods as tolerated. Avoid greasy, spicy, heavy foods. If nausea and/or vomiting occur, drink only clear liquids until the nausea and/or vomiting subsides. Call your physician if vomiting continues.  Special Instructions/Symptoms: Your  throat may feel dry or sore from the anesthesia or the breathing tube placed in your throat during surgery. If this causes discomfort, gargle with warm salt water. The discomfort should disappear within 24 hours.     

## 2021-07-01 NOTE — Anesthesia Preprocedure Evaluation (Signed)
Anesthesia Evaluation  Patient identified by MRN, date of birth, ID band Patient awake    Reviewed: Allergy & Precautions, NPO status , Patient's Chart, lab work & pertinent test results  History of Anesthesia Complications (+) PONVNegative for: history of anesthetic complications  Airway Mallampati: II  TM Distance: >3 FB     Dental   Pulmonary    breath sounds clear to auscultation       Cardiovascular hypertension,  Rhythm:Regular Rate:Normal     Neuro/Psych  Neuromuscular disease    GI/Hepatic negative GI ROS, Neg liver ROS,   Endo/Other  Hypothyroidism   Renal/GU History noted Dr. Nyoka Cowden     Musculoskeletal  (+) Arthritis ,   Abdominal   Peds  Hematology   Anesthesia Other Findings   Reproductive/Obstetrics                             Anesthesia Physical Anesthesia Plan  ASA: 3  Anesthesia Plan: General   Post-op Pain Management:    Induction: Intravenous  PONV Risk Score and Plan: 3 and Ondansetron, Dexamethasone and Midazolam  Airway Management Planned: LMA  Additional Equipment:   Intra-op Plan:   Post-operative Plan: Extubation in OR  Informed Consent: I have reviewed the patients History and Physical, chart, labs and discussed the procedure including the risks, benefits and alternatives for the proposed anesthesia with the patient or authorized representative who has indicated his/her understanding and acceptance.       Plan Discussed with: CRNA and Anesthesiologist  Anesthesia Plan Comments:         Anesthesia Quick Evaluation

## 2021-07-01 NOTE — Anesthesia Procedure Notes (Signed)
Procedure Name: LMA Insertion Date/Time: 07/01/2021 9:32 AM Performed by: Mechele Claude, CRNA Pre-anesthesia Checklist: Patient identified, Emergency Drugs available, Suction available and Patient being monitored Patient Re-evaluated:Patient Re-evaluated prior to induction Oxygen Delivery Method: Circle system utilized Preoxygenation: Pre-oxygenation with 100% oxygen Induction Type: IV induction Ventilation: Mask ventilation without difficulty LMA: LMA inserted LMA Size: 4.0 Number of attempts: 1 Airway Equipment and Method: Bite block Placement Confirmation: positive ETCO2 Tube secured with: Tape Dental Injury: Teeth and Oropharynx as per pre-operative assessment

## 2021-07-01 NOTE — Op Note (Signed)
Procedure: 1.  Cystoscopy with bilateral retrograde pyelograms and interpretation. 2.  Cystoscopy with hydrodistention of the bladder with instillation of Pyridium and Marcaine. 3.  Application of fluoroscopy.  Preop diagnosis: Microhematuria and painful bladder.  Postop diagnosis: Microhematuria with interstitial cystitis.  Surgeon: Dr. Irine Seal.  Anesthesia: General.  Specimen: None.  Drains: None.  EBL: None.  Findings: Normal bilateral retrograde pyelograms.  750 mL capacity bladder with some glomerulations consistent with interstitial cystitis.  Complications: None.  Indications: Patient is a 59 year old female with a history of microhematuria and a painful bladder it was felt that cystoscopy with bilateral retrograde pyelography and hydrodistention of bladder were indicated.  Procedure: She was taken back to the operating room where she was given Ancef.  A general anesthetic was induced.  She was placed in lithotomy position and fitted with PAS hose.  Perineum and genitalia were prepped with Betadine solution and she was draped in usual sterile fashion.  Cystoscopy was performed using the 21 [place] scope with 30 degree lens.  Examination revealed a normal urethra.  The bladder wall was smooth and pale without tumors, stones or inflammation.  The ureteral orifice ease were unremarkable.  Bilateral retrograde pyelography was performed with a 5 French opening catheter and Omnipaque.  The right retrograde pyelogram revealed a normal ureter and intrarenal collecting system.  The left retrograde pyelogram demonstrated normal ureter and intrarenal collecting system.  The bladder was then distended to capacity under 80 cm water pressure and then drained.  Capacity under anesthesia was 750 mL.  Repeat inspection of the bladder wall following hydrodistention demonstrated no tears cracks or ulcers but there were some glomerulations particularly adjacent to the ureteral orifice ease  that are suggestive of interstitial cystitis.  The cystoscope was removed after the bladder was drained and a 16 French red rubber catheter was used to instill 50 mL of half percent Marcaine with 400 mg of crushed Pyridium.  She was then taken down from lithotomy position, her anesthetic was reversed and she was moved recovery room in stable condition.  There were no complications.

## 2021-07-01 NOTE — Interval H&P Note (Signed)
History and Physical Interval Note:  07/01/2021 9:09 AM  Jasmine Lawson  has presented today for surgery, with the diagnosis of PAINFUL BLADDER AND HEMATURIA.  The various methods of treatment have been discussed with the patient and family. After consideration of risks, benefits and other options for treatment, the patient has consented to  Procedure(s): CYSTOSCOPY/HYDRODISTENSION INSTILLATION OF MARCAINE AND PYRIDIUM BILATERAL RETROGRADES (Bilateral) as a surgical intervention.  The patient's history has been reviewed, patient examined, no change in status, stable for surgery.  I have reviewed the patient's chart and labs.  Questions were answered to the patient's satisfaction.     Irine Seal

## 2021-07-01 NOTE — Transfer of Care (Signed)
Immediate Anesthesia Transfer of Care Note  Patient: Jasmine Lawson  Procedure(s) Performed: Procedure(s) (LRB): CYSTOSCOPY/HYDRODISTENSION INSTILLATION OF MARCAINE AND PYRIDIUM BILATERAL RETROGRADES (Bilateral)  Patient Location: PACU  Anesthesia Type: General  Level of Consciousness: awake, alert  and oriented  Airway & Oxygen Therapy: Patient Spontanous Breathing and Patient connected to nasal cannula oxygen  Post-op Assessment: Report given to PACU RN and Post -op Vital signs reviewed and stable  Post vital signs: Reviewed and stable  Complications: No apparent anesthesia complications  Last Vitals:  Vitals Value Taken Time  BP 156/82 07/01/21 0958  Temp 36.8 C 07/01/21 0958  Pulse 73 07/01/21 0959  Resp 17 07/01/21 0959  SpO2 98 % 07/01/21 0959  Vitals shown include unvalidated device data.  Last Pain:  Vitals:   07/01/21 0748  TempSrc: Oral  PainSc: 0-No pain      Patients Stated Pain Goal: 6 ([id number])  Complications: No notable events documented.

## 2021-07-02 ENCOUNTER — Encounter (HOSPITAL_BASED_OUTPATIENT_CLINIC_OR_DEPARTMENT_OTHER): Payer: Self-pay | Admitting: Urology

## 2021-07-31 ENCOUNTER — Ambulatory Visit: Payer: Commercial Managed Care - PPO | Admitting: Urology

## 2021-08-07 ENCOUNTER — Ambulatory Visit (INDEPENDENT_AMBULATORY_CARE_PROVIDER_SITE_OTHER): Payer: Commercial Managed Care - PPO | Admitting: Urology

## 2021-08-07 ENCOUNTER — Other Ambulatory Visit: Payer: Self-pay

## 2021-08-07 ENCOUNTER — Encounter: Payer: Self-pay | Admitting: Urology

## 2021-08-07 VITALS — BP 132/75 | HR 82

## 2021-08-07 DIAGNOSIS — R3129 Other microscopic hematuria: Secondary | ICD-10-CM

## 2021-08-07 DIAGNOSIS — N3010 Interstitial cystitis (chronic) without hematuria: Secondary | ICD-10-CM

## 2021-08-07 DIAGNOSIS — R102 Pelvic and perineal pain: Secondary | ICD-10-CM | POA: Diagnosis not present

## 2021-08-07 LAB — URINALYSIS, ROUTINE W REFLEX MICROSCOPIC
Bilirubin, UA: NEGATIVE
Glucose, UA: NEGATIVE
Ketones, UA: NEGATIVE
Leukocytes,UA: NEGATIVE
Nitrite, UA: NEGATIVE
Protein,UA: NEGATIVE
Specific Gravity, UA: 1.02 (ref 1.005–1.030)
Urobilinogen, Ur: 0.2 mg/dL (ref 0.2–1.0)
pH, UA: 5.5 (ref 5.0–7.5)

## 2021-08-07 LAB — MICROSCOPIC EXAMINATION
Bacteria, UA: NONE SEEN
Renal Epithel, UA: NONE SEEN /hpf

## 2021-08-07 NOTE — Progress Notes (Signed)
Subjective: 1. Suprapubic pain   2. Microhematuria   3. Chronic interstitial cystitis      Jasmine Lawson returns today in f/u.  She was last seen in 2020 and has a history of microhematuria with a negative w/u in 2016.  She was to return for cystoscopy in 2020 but didn't follow up.   She also has a history of vaginal atrophy with stenosis.  She has had some recurrent gross hematuria and has suprapubic pain with painful urgency.  She has frequency q1hr and nocturia x 2.  She has some post void dribbling but no incontinence.  She has some chronic intermittent left flank pain.  She has had no clots and the urine can be pink.   She has some dysuria.  She was last on an antibiotic about 2 weeks ago.   She had a negative cytology in June and her cultures have been negative.  She has held the topical estrogen since this has been going on.  She had a CT stone study in June that was negative.   08/07/21: Jasmine Lawson returns today in f/u from cystoscopy with bil RTG's with HOD.   She is doing better with reduced suprapubic pain and pressure. She has reduced nocturia.  Her UA has 3-10 RBC's.   The RTG's were negative.  Her bladder volume was 732m.  ROS:  ROS  Allergies  Allergen Reactions   Codeine Other (See Comments)    Vomiting.   Levothyroxine Sodium Other (See Comments)   Lisinopril Other (See Comments)   Synthroid [Levothyroxine] Other (See Comments)    Past Medical History:  Diagnosis Date   Arthritis 06/25/2021   legs feet and hands   Complication of anesthesia    COVID 11/2020   cold symptoms x 2 to 3 days all symtoms resolved   Hematuria 06/25/2021   Hypertension 06/25/2021   Hypothyroidism 06/25/2021   Lumbar radiculopathy 06/25/2021   left side, last injection was 2020 per pt   Palpitations 06/25/2021   PONV (postoperative nausea and vomiting)    SUI (stress urinary incontinence, female) 06/25/2021   occ   Wears glasses 06/25/2021    Past Surgical History:  Procedure Laterality Date    ABDOMINAL HYSTERECTOMY  2001   CESAREAN SECTION     x 2   CHOLECYSTECTOMY     non-functioning by Dr. FLindalou Hoseyrs ago laparoscopic 20 yrs ago per pt   COLONOSCOPY N/A 04/12/2014   Procedure: COLONOSCOPY;  Surgeon: NRogene Houston MD;  [facility];  Service: Endoscopy;  Laterality: N/A;  225 - moved to 730 - Ann notified pt   CYSTO WITH HYDRODISTENSION Bilateral 07/01/2021   Procedure: CYSTOSCOPY/HYDRODISTENSION INSTILLATION OF MARCAINE AND PYRIDIUM BILATERAL RETROGRADES;  Surgeon: WIrine Seal MD;  [facility]  Service: Urology;  Laterality: Bilateral;   Fissure tear repair     many yrs ago per pt on 06-25-2021   Rt rotator cuff repair     134to 20 yrs ago per pt on 06-25-2021    Social History   Socioeconomic History   Marital status: Married    Spouse name: Not on file   Number of children: Not on file   Years of education: Not on file   Highest education level: Not on file  Occupational History   Not on file  Tobacco Use   Smoking status: Never   Smokeless tobacco: Never  Vaping Use   Vaping Use: Never used  Substance and Sexual Activity   Alcohol  use: No   Drug use: No   Sexual activity: Yes    Birth control/protection: None  Other Topics Concern   Not on file  Social History Narrative   Not on file   Social Determinants of Health   Financial Resource Strain: Not on file  Food Insecurity: Not on file  Transportation Needs: Not on file  Physical Activity: Not on file  Stress: Not on file  Social Connections: Not on file  Intimate Partner Violence: Not on file    Family History  Problem Relation Age of Onset   Heart failure Mother    Deep vein thrombosis Mother    CAD Father    Colon cancer Neg Hx     Anti-infectives: Anti-infectives (From admission, onward)    None       Current Outpatient Medications  Medication Sig Dispense Refill   acetaminophen (TYLENOL) 500 MG tablet Take 500 mg by mouth every 6 (six) hours  as needed.     albuterol (VENTOLIN HFA) 108 (90 Base) MCG/ACT inhaler Inhale 2 puffs into the lungs every 6 (six) hours as needed.     atenolol (TENORMIN) 25 MG tablet TAKE 1 & 1/2 (ONE & ONE-HALF) TABLETS BY MOUTH ONCE DAILY (Patient taking differently: Takes 1 tab in am and 1/2 tab in pm) 135 tablet 2   cetirizine (ZYRTEC) 10 MG tablet Take 10 mg by mouth at bedtime.     conjugated estrogens (PREMARIN) vaginal cream Place 1 Applicatorful vaginally. Patient states that she uses twice a week.     dicyclomine (BENTYL) 10 MG capsule Take 1 capsule (10 mg total) by mouth 2 (two) times daily as needed for spasms. Patient will need to make a 3 month appointment with [facility] before any more refills or she may get from her PCP. Last seen in our office in 2019. 90 capsule 0   famotidine (PEPCID) 10 MG tablet Take 10 mg by mouth at bedtime.     levothyroxine (SYNTHROID) 75 MCG tablet Take 75 mcg by mouth daily.     montelukast (SINGULAIR) 10 MG tablet Take 10 mg by mouth at bedtime.     RESTASIS 0.05 % ophthalmic emulsion 1 drop 2 (two) times daily.     spironolactone (ALDACTONE) 25 MG tablet Take 12.5 mg by mouth daily.      No current facility-administered medications for this visit.     Objective: Vital signs in last 24 hours: BP 132/75   Pulse 82   Intake/Output from previous day: No intake/output data recorded. Intake/Output this shift: '@IOTHISSHIFT'$ @   Physical Exam  Lab Results:  Results for orders placed or performed in visit on 08/07/21 (from the past 24 hour(s))  Urinalysis, Routine w reflex microscopic     Status: Abnormal   Collection Time: 08/07/21  3:57 PM  Result Value Ref Range   Specific Gravity, UA 1.020 1.005 - 1.030   pH, UA 5.5 5.0 - 7.5   Color, UA Yellow Yellow   Appearance Ur Clear Clear   Leukocytes,UA Negative Negative   Protein,UA Negative Negative/Trace   Glucose, UA Negative Negative   Ketones, UA Negative Negative   RBC, UA 2+ (A) Negative    Bilirubin, UA Negative Negative   Urobilinogen, Ur 0.2 0.2 - 1.0 mg/dL   Nitrite, UA Negative Negative   Microscopic Examination See below:    Narrative   Performed at:  [facility] [address], [place], [place]  [id number] Lab Director: Mina Marble MT, Phone:  [phone]  Microscopic Examination     Status: Abnormal   Collection Time: 08/07/21  3:57 PM   Urine  Result Value Ref Range   WBC, UA 0-5 0 - 5 /hpf   RBC 3-10 (A) 0 - 2 /hpf   Epithelial Cells (non renal) 0-10 0 - 10 /hpf   Renal Epithel, UA None seen None seen /hpf   Bacteria, UA None seen None seen/Few   Narrative   Performed at:  [facility] [address] [place], [place]  [id number] Lab Director: [place], Phone:  [phone]    BMET No results for input(s): NA, K, CL, CO2, GLUCOSE, BUN, CREATININE, CALCIUM in the last 72 hours. PT/INR No results for input(s): LABPROT, INR in the last 72 hours. ABG No results for input(s): PHART, HCO3 in the last 72 hours.  Invalid input(s): PCO2, PO2   UA reviewed and she has microhematuria.    Studies/Results: Prior office notes reviewed.  CT stone study from [facility] reviewed.  Outside cytology reviewed.    Assessment/Plan: Interstitial cystitis.   She has mild IC and a good response to HOD.   I discussed dietary restrictions and hydration.  She will return in 74month.  Hematuria.  Her w/u was otherwise negative.     No orders of the defined types were placed in this encounter.    Orders Placed This Encounter  Procedures   Microscopic Examination   Urinalysis, Routine w reflex microscopic       Return in about 6 months (around 02/04/2022).    CC: PConsuello MasseMD.      JIrine Seal9/15/2022 [id number] ID: NThreasa Beards female   DOB: [birth year] 60y.o.   MRN: [id number]

## 2021-08-07 NOTE — Progress Notes (Signed)
Urological Symptom Review  Patient is experiencing the following symptoms: Frequent urination Blood in urine   Review of Systems  Gastrointestinal (upper)  : Negative for upper GI symptoms  Gastrointestinal (lower) : Negative for lower GI symptoms  Constitutional : Negative for symptoms  Skin: Negative for skin symptoms  Eyes: Negative for eye symptoms  Ear/Nose/Throat : Negative for Ear/Nose/Throat symptoms  Hematologic/Lymphatic: Negative for Hematologic/Lymphatic symptoms  Cardiovascular : Negative for cardiovascular symptoms  Respiratory : Negative for respiratory symptoms  Endocrine: Negative for endocrine symptoms  Musculoskeletal: Negative for musculoskeletal symptoms  Neurological: Negative for neurological symptoms  Psychologic: Negative for psychiatric symptoms

## 2021-11-23 HISTORY — PX: OTHER SURGICAL HISTORY: SHX169

## 2022-02-05 ENCOUNTER — Ambulatory Visit: Payer: Commercial Managed Care - PPO | Admitting: Urology

## 2022-03-11 ENCOUNTER — Ambulatory Visit (INDEPENDENT_AMBULATORY_CARE_PROVIDER_SITE_OTHER): Payer: Commercial Managed Care - PPO

## 2022-03-11 ENCOUNTER — Other Ambulatory Visit: Payer: Self-pay | Admitting: Cardiology

## 2022-03-11 ENCOUNTER — Telehealth: Payer: Self-pay | Admitting: Cardiology

## 2022-03-11 DIAGNOSIS — R002 Palpitations: Secondary | ICD-10-CM

## 2022-03-11 MED ORDER — ATENOLOL 25 MG PO TABS: 25.0000 mg | ORAL_TABLET | Freq: Two times a day (BID) | ORAL | 2 refills | Status: DC

## 2022-03-11 NOTE — Telephone Encounter (Signed)
Reports palpitations and heart racing when sleeping or getting startled that she's had for awhile. Says for the past 2 days, she is now having palpitations during the day time that's associated with chest pressure rated 5/10. Denies caffeine or stress. Has not checked BP or HR and doesn't have home device to monitor vitals. Denies dizziness or sob. Currently at home babysitting grandchildren. Medications reviewed. 1st available appointment given to see [facility] on 06/17/22. Message routed to provider for advice.  ?

## 2022-03-11 NOTE — Telephone Encounter (Signed)
Patient walked in stating that she is experiencing a "racing heartbeat" every now and then it use to be only when she was lying down. She is noticing its happening now when she is walking around as well. She scheduled a FU it's not till July with [place] in [place]. Added her to the wait-list as well. She has asked that I send a msg to the nurse and, see if Dr. Domenic Polite can adjust her tenormin. ?

## 2022-03-11 NOTE — Telephone Encounter (Signed)
Patient informed and verbalized understanding of plan. 

## 2022-03-12 ENCOUNTER — Ambulatory Visit: Payer: Commercial Managed Care - PPO

## 2022-03-14 DIAGNOSIS — R002 Palpitations: Secondary | ICD-10-CM | POA: Diagnosis not present

## 2022-04-15 ENCOUNTER — Other Ambulatory Visit: Payer: Self-pay | Admitting: Cardiology

## 2022-06-16 NOTE — Progress Notes (Unsigned)
Cardiology Office Note    Date:  06/17/2022   ID:  Jasmine Lawson, DOB [birth year], MRN [id number]  PCP:  Manon Hilding, MD  Cardiologist: Rozann Lesches, MD    Chief Complaint  Patient presents with   Follow-up    Annual Visit    History of Present Illness:    Jasmine Lawson is a 61 y.o. female with past medical history of palpitations, HTN and hypothyroidism who presents to the office today for overdue annual follow-up.  She was last examined by Dr. Domenic Polite in 01/2021 and reported occasional palpitations but denied any recent anginal symptoms. She was continued on her current cardiac medications at that time.  She did contact the office in 02/2022 reporting worsening palpitations and Dr. Domenic Polite recommended increasing Atenolol to 50 mg daily and placing a 72-hour Zio patch. Her monitor showed predominantly normal sinus rhythm with an average heart rate of 70 bpm. She did have rare PAC's and PVC's representing less than 1% of total beats. Also had brief episodes of PSVT with the longest lasting for 8 beats. She reported worsening weakness in her legs with the higher dose of Atenolol, therefore this was ultimately reduced to her prior dose of 25 mg in AM/12.'5mg'$  in PM.   In talking with the patient today, she reports overall doing well for the past few months. Says that her palpitations have mostly resolved at this time. She has been exercising by walking several times a day and feels like this has tremendously helped with her palpitations and overall energy level. She denies any recent exertional chest pain or dyspnea on exertion. No recent orthopnea, PND or pitting edema. She is followed by Endocrinology for her hypothyroidism.  Past Medical History:  Diagnosis Date   Arthritis 06/25/2021   legs feet and hands   Complication of anesthesia    COVID 11/2020   cold symptoms x 2 to 3 days all symtoms resolved   Hematuria 06/25/2021   Hypertension 06/25/2021   Hypothyroidism  06/25/2021   Lumbar radiculopathy 06/25/2021   left side, last injection was 2020 per pt   Palpitations 06/25/2021   PONV (postoperative nausea and vomiting)    SUI (stress urinary incontinence, female) 06/25/2021   occ   Wears glasses 06/25/2021    Past Surgical History:  Procedure Laterality Date   ABDOMINAL HYSTERECTOMY  2001   CESAREAN SECTION     x 2   CHOLECYSTECTOMY     non-functioning by Dr. Lindalou Hose yrs ago laparoscopic 20 yrs ago per pt   COLONOSCOPY N/A 04/12/2014   Procedure: COLONOSCOPY;  Surgeon: Rogene Houston, MD;  [facility];  Service: Endoscopy;  Laterality: N/A;  225 - moved to 730 - Ann notified pt   CYSTO WITH HYDRODISTENSION Bilateral 07/01/2021   Procedure: CYSTOSCOPY/HYDRODISTENSION INSTILLATION OF MARCAINE AND PYRIDIUM BILATERAL RETROGRADES;  Surgeon: Irine Seal, MD;  Location: [facility];  Service: Urology;  Laterality: Bilateral;   Fissure tear repair     many yrs ago per pt on 06-25-2021   Rt rotator cuff repair     15 to 20 yrs ago per pt on 06-25-2021    Current Medications: Outpatient Medications Prior to Visit  Medication Sig Dispense Refill   acetaminophen (TYLENOL) 500 MG tablet Take 500 mg by mouth every 6 (six) hours as needed.     albuterol (VENTOLIN HFA) 108 (90 Base) MCG/ACT inhaler Inhale 2 puffs into the lungs every 6 (six) hours as needed.  atenolol (TENORMIN) 25 MG tablet Take 1 tablet (25 mg total) by mouth in the morning. & 12.5 mg in the evening 135 tablet 2   cetirizine (ZYRTEC) 10 MG tablet Take 10 mg by mouth at bedtime.     conjugated estrogens (PREMARIN) vaginal cream Place 1 Applicatorful vaginally. Patient states that she uses twice a week.     dicyclomine (BENTYL) 10 MG capsule Take 1 capsule (10 mg total) by mouth 2 (two) times daily as needed for spasms. Patient will need to make a 3 month appointment with [facility] before any more refills or she may get from her PCP. Last seen in our office  in 2019. 90 capsule 0   famotidine (PEPCID) 10 MG tablet Take 10 mg by mouth at bedtime.     levothyroxine (SYNTHROID) 75 MCG tablet Take 75 mcg by mouth daily.     montelukast (SINGULAIR) 10 MG tablet Take 10 mg by mouth at bedtime.     spironolactone (ALDACTONE) 25 MG tablet Take 12.5 mg by mouth daily.      XIIDRA 5 % SOLN Apply 1 drop to eye 2 (two) times daily.     RESTASIS 0.05 % ophthalmic emulsion 1 drop 2 (two) times daily.     No facility-administered medications prior to visit.     Allergies:   Codeine, Levothyroxine sodium, Lisinopril, and Synthroid [levothyroxine]   Social History   Socioeconomic History   Marital status: Married    Spouse name: Not on file   Number of children: Not on file   Years of education: Not on file   Highest education level: Not on file  Occupational History   Not on file  Tobacco Use   Smoking status: Never   Smokeless tobacco: Never  Vaping Use   Vaping Use: Never used  Substance and Sexual Activity   Alcohol use: No   Drug use: No   Sexual activity: Yes    Birth control/protection: None  Other Topics Concern   Not on file  Social History Narrative   Not on file   Social Determinants of Health   Financial Resource Strain: Not on file  Food Insecurity: Not on file  Transportation Needs: Not on file  Physical Activity: Not on file  Stress: Not on file  Social Connections: Not on file     Family History:  The patient's family history includes CAD in her father; Deep vein thrombosis in her mother; Heart failure in her mother.   Review of Systems:    Please see the history of present illness.     All other systems reviewed and are otherwise negative except as noted above.   Physical Exam:    VS:  BP 126/82   Pulse 70   Ht '5\' 5"'$  (1.651 m)   Wt 239 lb 6.4 oz (108.6 kg)   SpO2 96%   BMI 39.84 kg/m    General: Well developed, well nourished,female appearing in no acute distress. Head: Normocephalic, atraumatic. Neck:  No carotid bruits. JVD not elevated.  Lungs: Respirations regular and unlabored, without wheezes or rales.  Heart: Regular rate and rhythm. No S3 or S4.  No murmur, no rubs, or gallops appreciated. Abdomen: Appears non-distended. No obvious abdominal masses. Msk:  Strength and tone appear normal for age. No obvious joint deformities or effusions. Extremities: No clubbing or cyanosis. No pitting edema.  Distal pedal pulses are 2+ bilaterally. Neuro: Alert and oriented X 3. Moves all extremities spontaneously. No focal deficits noted. Psych:  Responds to questions appropriately with a normal affect. Skin: No rashes or lesions noted  Wt Readings from Last 3 Encounters:  06/17/22 239 lb 6.4 oz (108.6 kg)  07/01/21 237 lb 4.8 oz (107.6 kg)  06/19/21 240 lb 3.2 oz (109 kg)     Studies/Labs Reviewed:   EKG:  EKG is ordered today. The ekg ordered today demonstrates NSR, HR 70 with no acute ST changes.   Recent Labs: 07/01/2021: BUN 13; Creatinine, Ser 0.60; Hemoglobin 14.3; Potassium 3.8; Sodium 141   Lipid Panel No results found for: "CHOL", "TRIG", "HDL", "CHOLHDL", "VLDL", "LDLCALC", "LDLDIRECT"  Additional studies/ records that were reviewed today include:   Echocardiogram: 06/2020 IMPRESSIONS     1. Left ventricular ejection fraction, by estimation, is 50 to 55%. The  left ventricle has low normal function. The left ventricle has no regional  wall motion abnormalities. There is mild left ventricular hypertrophy.  Left ventricular diastolic  parameters are indeterminate.   2. Right ventricular systolic function is normal. The right ventricular  size is normal.   3. Left atrial size was mildly dilated.   4. The mitral valve is normal in structure. No evidence of mitral valve  regurgitation. No evidence of mitral stenosis.   5. The aortic valve was not well visualized. Aortic valve regurgitation  is not visualized. No aortic stenosis is present.   6. The inferior vena cava is  normal in size with greater than 50%  respiratory variability, suggesting right atrial pressure of 3 mmHg.   NST: 06/2020 There was no ST segment deviation noted during stress. The study is normal. There are no perfusion defects consistent with prior infarct or current ischemia. This is a low risk study. The left ventricular ejection fraction is normal (55-65%).  Event Monitor: 03/2022 ZIO XT reviewed. Two days, 15 hours analyzed.  Predominant rhythm is sinus with heart rate ranging from 52 bpm up to 109 bpm and average heart rate 70 bpm.  There were rare PACs including atrial couplets and triplets representing less than 1% total beats.  There were also rare PVCs representing less than 1% total beats.  Brief episodes of PSVT were noted, the longest lasting was only 8 beats.  No sustained arrhythmias or pauses.  Assessment:    1. Palpitations   2. Essential hypertension   3. Hypothyroidism, unspecified type      Plan:   In order of problems listed above:  1. Palpitations - Her monitor earlier this year showed predominately NSR with rare PAC's, PVC's and brief SVT but no significant arrhythmias. She reports her symptoms have significantly improved since exercising more routinely and reducing her caffeine intake. Continue current medication regimen for now with Atenolol '25mg'$  in AM/12.'5mg'$  in PM.   2. HTN - BP is well-controlled at 126/82 during today's visit. Continue current medication regimen with Atenolol '25mg'$  in AM/12.'5mg'$  in PM and Spironolactone 12.'5mg'$  daily.   3. Hypothyroidism - Followed by PCP. She remains on Synthroid 75 mcg daily. We will request a copy of her most recent labs.     Medication Adjustments/Labs and Tests Ordered: Current medicines are reviewed at length with the patient today.  Concerns regarding medicines are outlined above.  Medication changes, Labs and Tests ordered today are listed in the Patient Instructions below. Patient Instructions  Medication  Instructions:   Continue current medication regimen.   *If you need a refill on your cardiac medications before your next appointment, please call your pharmacy*   Follow-Up: At [facility], you  and your health needs are our priority.  As part of our continuing mission to provide you with exceptional heart care, we have created designated Provider Care Teams.  These Care Teams include your primary Cardiologist (physician) and Advanced Practice Providers (APPs -  Physician Assistants and Nurse Practitioners) who all work together to provide you with the care you need, when you need it.  We recommend signing up for the patient portal called "MyChart".  Sign up information is provided on this After Visit Summary.  MyChart is used to connect with patients for Virtual Visits (Telemedicine).  Patients are able to view lab/test results, encounter notes, upcoming appointments, etc.  Non-urgent messages can be sent to your provider as well.   To learn more about what you can do with MyChart, go to [email or web address].    Your next appointment:   1 year(s)  The format for your next appointment:   In Person  Provider:   You may see Rozann Lesches, MD or one of the following Advanced Practice Providers on your designated Care Team:   Bernerd Pho, PA-C  Ermalinda Barrios, [place] {   Important Information About Sugar         Signed, Erma Heritage, PA-C  06/17/2022 2:40 PM    [facility] [address] [place], [place] [address] Phone: [phone] Fax: [phone]

## 2022-06-17 ENCOUNTER — Encounter: Payer: Self-pay | Admitting: *Deleted

## 2022-06-17 ENCOUNTER — Encounter: Payer: Self-pay | Admitting: Student

## 2022-06-17 ENCOUNTER — Ambulatory Visit (INDEPENDENT_AMBULATORY_CARE_PROVIDER_SITE_OTHER): Payer: Commercial Managed Care - PPO | Admitting: Student

## 2022-06-17 VITALS — BP 126/82 | HR 70 | Ht 65.0 in | Wt 239.4 lb

## 2022-06-17 DIAGNOSIS — R002 Palpitations: Secondary | ICD-10-CM

## 2022-06-17 DIAGNOSIS — E039 Hypothyroidism, unspecified: Secondary | ICD-10-CM

## 2022-06-17 DIAGNOSIS — I10 Essential (primary) hypertension: Secondary | ICD-10-CM | POA: Diagnosis not present

## 2022-06-17 NOTE — Patient Instructions (Signed)
Medication Instructions:   Continue current medication regimen.   *If you need a refill on your cardiac medications before your next appointment, please call your pharmacy*   Follow-Up: At [facility], you and your health needs are our priority.  As part of our continuing mission to provide you with exceptional heart care, we have created designated Provider Care Teams.  These Care Teams include your primary Cardiologist (physician) and Advanced Practice Providers (APPs -  Physician Assistants and Nurse Practitioners) who all work together to provide you with the care you need, when you need it.  We recommend signing up for the patient portal called "MyChart".  Sign up information is provided on this After Visit Summary.  MyChart is used to connect with patients for Virtual Visits (Telemedicine).  Patients are able to view lab/test results, encounter notes, upcoming appointments, etc.  Non-urgent messages can be sent to your provider as well.   To learn more about what you can do with MyChart, go to [email or web address].    Your next appointment:   1 year(s)  The format for your next appointment:   In Person  Provider:   You may see Rozann Lesches, MD or one of the following Advanced Practice Providers on your designated Care Team:   Bernerd Pho, PA-C  Ermalinda Barrios, PA-C {   Important Information About Sugar

## 2022-08-23 HISTORY — PX: OTHER SURGICAL HISTORY: SHX169

## 2022-11-03 ENCOUNTER — Other Ambulatory Visit (HOSPITAL_COMMUNITY): Payer: Self-pay | Admitting: Endocrinology

## 2022-11-03 DIAGNOSIS — E049 Nontoxic goiter, unspecified: Secondary | ICD-10-CM

## 2022-11-13 ENCOUNTER — Ambulatory Visit (HOSPITAL_COMMUNITY)
Admission: RE | Admit: 2022-11-13 | Discharge: 2022-11-13 | Disposition: A | Discharge status: Home / Self Care | Payer: Commercial Managed Care - PPO | Source: Ambulatory Visit | Attending: Endocrinology | Admitting: Endocrinology

## 2022-11-13 DIAGNOSIS — E049 Nontoxic goiter, unspecified: Secondary | ICD-10-CM | POA: Diagnosis present

## 2023-03-10 ENCOUNTER — Ambulatory Visit (INDEPENDENT_AMBULATORY_CARE_PROVIDER_SITE_OTHER): Payer: Commercial Managed Care - PPO | Admitting: Adult Health

## 2023-03-10 ENCOUNTER — Other Ambulatory Visit (HOSPITAL_COMMUNITY)
Admission: RE | Admit: 2023-03-10 | Discharge: 2023-03-10 | Disposition: A | Discharge status: Home / Self Care | Payer: Commercial Managed Care - PPO | Source: Ambulatory Visit | Attending: Adult Health | Admitting: Adult Health

## 2023-03-10 ENCOUNTER — Encounter: Payer: Self-pay | Admitting: Adult Health

## 2023-03-10 VITALS — BP 160/101 | HR 86 | Ht 65.0 in | Wt 238.0 lb

## 2023-03-10 DIAGNOSIS — B369 Superficial mycosis, unspecified: Secondary | ICD-10-CM

## 2023-03-10 DIAGNOSIS — N898 Other specified noninflammatory disorders of vagina: Secondary | ICD-10-CM | POA: Diagnosis present

## 2023-03-10 DIAGNOSIS — R319 Hematuria, unspecified: Secondary | ICD-10-CM

## 2023-03-10 LAB — POCT URINALYSIS DIPSTICK
Glucose, UA: NEGATIVE
Ketones, UA: NEGATIVE
Leukocytes, UA: NEGATIVE
Nitrite, UA: NEGATIVE
Protein, UA: NEGATIVE

## 2023-03-10 MED ORDER — CLOTRIMAZOLE-BETAMETHASONE 1-0.05 % EX CREA: 1.0000 | TOPICAL_CREAM | Freq: Two times a day (BID) | CUTANEOUS | 1 refills | Status: DC

## 2023-03-10 MED ORDER — FLUCONAZOLE 150 MG PO TABS: ORAL_TABLET | ORAL | 1 refills | Status: DC

## 2023-03-10 NOTE — Progress Notes (Signed)
Subjective:     Patient ID: Jasmine Lawson, female   DOB: 13-Apr-1961, 62 y.o.   MRN: [id number]  HPI Jasmine Lawson is a 62 year old white female,married, sp hysterectomy in complaining of frequent yeast infections and blood in urine and pain left side at  C-section scar. She has seen urology, has IC(not on meds). She has used PVC ,but feels like it irritates.  She says gets yeast under breasts and under panniculus, and will have odor  PCP is Dr Neita Carp   Review of Systems +Frequent yeast infections and blood in urine and pain left side at  C-section scar. She has seen urology, has IC.    Reviewed past medical,surgical, social and family history. Reviewed medications and allergies.  Objective:   Physical Exam BP (!) 160/101 (BP Location: Right Arm, Patient Position: Sitting, Cuff Size: Large)   Pulse 86   Ht  (1.651 m)   Wt 238 lb (108 kg)   BMI 39.61 kg/m  urine dipstick small blood. Skin warm and dry. Lungs: clear to ausculation bilaterally. Cardiovascular: regular rate and rhythm.  She is red under right breast. Pelvic: external genitalia is normal in appearance no lesions, vagina: scant white discharge without odor,urethra has no lesions or masses noted, cervix and uterus are absent, adnexa: no masses or tenderness noted. Bladder is  tender and no masses felt, and tender left side  C-section scar. CV swab sent     AA is 0 Fall risk high    03/10/2023   10:11 AM  Depression screen PHQ 2/9  Decreased Interest 0  Down, Depressed, Hopeless 0  PHQ - 2 Score 0  Altered sleeping 0  Tired, decreased energy 0  Change in appetite 0  Feeling bad or failure about yourself  0  Trouble concentrating 0  Moving slowly or fidgety/restless 0  Suicidal thoughts 0  PHQ-9 Score 0       03/10/2023   10:11 AM  GAD 7 : Generalized Anxiety Score  Nervous, Anxious, on Edge 0  Control/stop worrying 0  Worry too much - different things 0  Trouble relaxing 0  Restless 0  Easily annoyed or irritable  0  Afraid - awful might happen 0  Total GAD 7 Score 0      Upstream - 03/10/23 1028       Pregnancy Intention Screening   Does the patient want to become pregnant in the next year? N/A    Does the patient's partner want to become pregnant in the next year? N/A    Would the patient like to discuss contraceptive options today? N/A      Contraception Wrap Up   Current Method Female Sterilization   hyst   End Method Female Sterilization   hyst   Contraception Counseling Provided No            Examination chaperoned by Gladys Damme NP student, and assisted with exam.  Assessment:     1. Hematuria, unspecified type Urine sent for UA C&S to rule out UTI  - POCT Urinalysis Dipstick - Urine Culture - Urinalysis, Routine w reflex microscopic Review IC diet   2. Superficial fungus infection of skin +yeast under breast  Will rx lotrisone and diflucan She has nystatin powders at home Keep clean and dry  Meds ordered this encounter  Medications   clotrimazole-betamethasone (LOTRISONE) cream    Sig: Apply 1 Application topically 2 (two) times daily.    Dispense:  45 g    Refill:  1    Order Specific Question:   Supervising Provider    Answer:   Duane Lope H [[id number]]   fluconazole (DIFLUCAN) 150 MG tablet    Sig: Take 1 now and 1 in 3 days    Dispense:  2 tablet    Refill:  1    Order Specific Question:   Supervising Provider    Answer:   EURE, LUTHER H [[id number]]     3. Vaginal discharge CV swab sent for yeast  - Cervicovaginal ancillary only( [place])     Plan:     Follow up prn

## 2023-03-11 LAB — URINALYSIS, ROUTINE W REFLEX MICROSCOPIC
Bilirubin, UA: NEGATIVE
Glucose, UA: NEGATIVE
Ketones, UA: NEGATIVE
Leukocytes,UA: NEGATIVE
Nitrite, UA: NEGATIVE
Protein,UA: NEGATIVE
RBC, UA: NEGATIVE
Specific Gravity, UA: 1.016 (ref 1.005–1.030)
Urobilinogen, Ur: 1 mg/dL (ref 0.2–1.0)
pH, UA: 7.5 (ref 5.0–7.5)

## 2023-03-12 LAB — CERVICOVAGINAL ANCILLARY ONLY
Candida Glabrata: NEGATIVE
Candida Vaginitis: POSITIVE — AB
Comment: NEGATIVE
Comment: NEGATIVE

## 2023-03-12 LAB — URINE CULTURE

## 2023-06-24 ENCOUNTER — Other Ambulatory Visit: Payer: Self-pay | Admitting: Adult Health

## 2023-06-24 MED ORDER — FLUCONAZOLE 150 MG PO TABS: ORAL_TABLET | ORAL | 2 refills | Status: DC

## 2023-06-24 NOTE — Progress Notes (Signed)
Refilled diflucan 

## 2023-10-13 ENCOUNTER — Encounter: Payer: Self-pay | Admitting: Cardiology

## 2023-10-13 ENCOUNTER — Ambulatory Visit: Payer: Commercial Managed Care - PPO | Attending: Cardiology | Admitting: Cardiology

## 2023-10-13 VITALS — BP 138/86 | HR 84 | Ht 65.0 in | Wt 238.0 lb

## 2023-10-13 DIAGNOSIS — I10 Essential (primary) hypertension: Secondary | ICD-10-CM

## 2023-10-13 DIAGNOSIS — R002 Palpitations: Secondary | ICD-10-CM | POA: Diagnosis not present

## 2023-10-13 NOTE — Patient Instructions (Signed)
Medication Instructions:  Your physician recommends that you continue on your current medications as directed. Please refer to the Current Medication list given to you today.   Labwork: None today  Testing/Procedures: None today  Follow-Up: 1 year  Any Other Special Instructions Will Be Listed Below (If Applicable).  If you need a refill on your cardiac medications before your next appointment, please call your pharmacy.  

## 2023-10-13 NOTE — Progress Notes (Signed)
    Cardiology Office Note  Date: 10/13/2023   ID: Jasmine Lawson, DOB 12/05/1960, MRN [id number]  History of Present Illness: Jasmine Lawson is a 62 y.o. female last seen in July 2023 by Ms. Strader PA-C, reviewed the note.  She is here for a routine visit.  Reports no progressive palpitations, no dizziness or syncope.  She has come off atenolol in the interim, states that she actually feels somewhat better off of the medication.  I reviewed her medications.  She remains on Aldactone for control of blood pressure.  Continues to follow with PCP.  I reviewed her lab work from June.  ECG today shows normal sinus rhythm with low voltage in the precordial leads.  Physical Exam: VS:  BP 138/86   Pulse 84   Ht 5\' 5"  (1.651 m)   Wt 238 lb (108 kg)   SpO2 98%   BMI 39.61 kg/m , BMI Body mass index is 39.61 kg/m.  Wt Readings from Last 3 Encounters:  10/13/23 238 lb (108 kg)  03/10/23 238 lb (108 kg)  06/17/22 239 lb 6.4 oz (108.6 kg)    General: Patient appears comfortable at rest. HEENT: Conjunctiva and lids normal. Neck: Supple, no elevated JVP or carotid bruits. Lungs: Clear to auscultation, nonlabored breathing at rest. Cardiac: Regular rate and rhythm, no S3 or significant systolic murmur.  ECG:  An ECG dated 06/17/2022 was personally reviewed today and demonstrated:  Sinus rhythm with low voltage in the precordial leads.  Labwork:  June 2024: Hemoglobin 14.6, platelets 318, BUN 14, creatinine 0.68, potassium 4.3, AST 19, ALT 11, TSH 1.73  Other Studies Reviewed Today:  No interval cardiac testing for review today.  Assessment and Plan:  1.  Palpitations, cardiac monitor in April 2023 indicated rare PACs and PVCs as well as brief episodes of PSVT.  She reports no progressive symptoms and in fact is off atenolol at this time.  ECG reviewed and stable.  Continue with observation for now.  2.  Primary hypertension.  Continue Aldactone and keep follow-up with  PCP.  Disposition:  Follow up  1 year.  Signed, Jonelle Sidle, M.D., F.A.C.C. [facility] at [employer]

## 2023-10-19 ENCOUNTER — Ambulatory Visit: Payer: Commercial Managed Care - PPO | Admitting: Gastroenterology

## 2023-11-01 NOTE — Progress Notes (Unsigned)
GI Office Note    Referring Provider: Juliette Alcide, MD Primary Care Physician:  Juliette Alcide, MD Primary Gastroenterologist: Vista Lawman, MD  Date:  11/01/2023  ID:  Jasmine Lawson, DOB [birth year], MRN [id number]   Chief Complaint   No chief complaint on file.   History of Present Illness  Jasmine Lawson is a 62 y.o. female with a history of IBS (presumed diarrhea), arthritis, HTN, hypothyroidism, and stress urinary incontinence presenting today with complaint of ***  Colonoscopy May 2015: -Mild sigmoid colonic diverticulosis -Non-adenomatous polyps -10-year repeat recommended  Last office visit 08/24/2017.  Noted to have a history of IBS and was doing well on as needed dicyclomine until September 2018 when she developed nausea and abdominal pain.  Pain primarily left-sided and it felt tightness and a tugging sensation.  She then developed a low-grade fever and had no appetite and did see some improvement with dicyclomine but then had explosive diarrhea.  She had reported 5-7 bowel movements a day and went to the ER.  She had a CT scan at that time it was felt that she was having a flareup of IBS and was discharged to follow-up with GI.  At this time she was using dicyclomine on schedule and was feeling better but not back to baseline.  Appetite slowly improving but still having discomfort in her left upper quadrant.  She was taking Tylenol and/or Advil for pain which she felt was helping.  She was advised to continue dicyclomine scheduled until back to baseline.  Advised no further workup of her left upper quadrant pain unless it were to get worse.  She was advised to continue Tylenol and ibuprofen.  Telephone recommendations given in 2021 for fear of diverticulitis and diarrhea. She was given abx by PCP and instructed to take imodium no more than 6 mg in divided doses and to call if no improvement in 1 week.   Plan referral paperwork she had a visit with PCP 10/12/2023  with complaint of IBS symptoms.  She requested another referral to see GI given she has not been seen in 3 years.  She had noted some recent flares, sometimes notices call sometimes does not.  Reportedly taken dicyclomine in the past.  Also noted a history of diverticulitis.  Has intermittent left-sided abdominal pain.  Today:    Wt Readings from Last 3 Encounters:  10/13/23 238 lb (108 kg)  03/10/23 238 lb (108 kg)  06/17/22 239 lb 6.4 oz (108.6 kg)    Current Outpatient Medications  Medication Sig Dispense Refill   acetaminophen (TYLENOL) 500 MG tablet Take 500 mg by mouth every 6 (six) hours as needed.     albuterol (VENTOLIN HFA) 108 (90 Base) MCG/ACT inhaler Inhale 2 puffs into the lungs every 6 (six) hours as needed.     cetirizine (ZYRTEC) 10 MG tablet Take 10 mg by mouth at bedtime.     clotrimazole-betamethasone (LOTRISONE) cream Apply 1 Application topically 2 (two) times daily. 45 g 1   conjugated estrogens (PREMARIN) vaginal cream Place 1 Applicatorful vaginally. Patient states that she uses once a week.     famotidine (PEPCID) 10 MG tablet Take 10 mg by mouth at bedtime.     levothyroxine (SYNTHROID) 75 MCG tablet Take 75 mcg by mouth daily before breakfast. On brand name     montelukast (SINGULAIR) 10 MG tablet Take 10 mg by mouth at bedtime.     spironolactone (ALDACTONE) 25 MG tablet Take 12.5 mg  by mouth daily.      XIIDRA 5 % SOLN Apply 1 drop to eye 2 (two) times daily.     No current facility-administered medications for this visit.    Past Medical History:  Diagnosis Date   Arthritis 06/25/2021   legs feet and hands   Complication of anesthesia    COVID 11/2020   cold symptoms x 2 to 3 days all symtoms resolved   Hematuria 06/25/2021   Hypertension 06/25/2021   Hypothyroidism 06/25/2021   IC (interstitial cystitis)    Lumbar radiculopathy 06/25/2021   left side, last injection was 2020 per pt   Palpitations 06/25/2021   PONV (postoperative nausea and  vomiting)    SUI (stress urinary incontinence, female) 06/25/2021   occ   Wears glasses 06/25/2021    Past Surgical History:  Procedure Laterality Date   ABDOMINAL HYSTERECTOMY  2001   bladder stretch  08/2022   cataract  2023   CESAREAN SECTION     x 2   CHOLECYSTECTOMY     non-functioning by Dr. Cleotis Nipper yrs ago laparoscopic 20 yrs ago per pt   COLONOSCOPY N/A 04/12/2014   Procedure: COLONOSCOPY;  Surgeon: Malissa Hippo, MD;  [facility];  Service: Endoscopy;  Laterality: N/A;  225 - moved to 730 - Ann notified pt   CYSTO WITH HYDRODISTENSION Bilateral 07/01/2021   Procedure: CYSTOSCOPY/HYDRODISTENSION INSTILLATION OF MARCAINE AND PYRIDIUM BILATERAL RETROGRADES;  Surgeon: Bjorn Pippin, MD;  Location: [facility];  Service: Urology;  Laterality: Bilateral;   Fissure tear repair     many yrs ago per pt on 06-25-2021   Rt rotator cuff repair     15 to 20 yrs ago per pt on 06-25-2021    Family History  Problem Relation Age of Onset   Heart failure Mother    Deep vein thrombosis Mother    CAD Father    Cancer Sister        pancreatic   Thyroid disease Daughter    Thyroid disease Daughter    Cancer Other        maternal nephew-pancreatic   Colon cancer Neg Hx     Allergies as of 11/02/2023 - Review Complete 10/13/2023  Allergen Reaction Noted   Codeine Other (See Comments) 03/12/2014   Levothyroxine sodium Other (See Comments) 09/17/2020   Lisinopril Other (See Comments) 09/17/2020    Social History   Socioeconomic History   Marital status: Married    Spouse name: Not on file   Number of children: Not on file   Years of education: Not on file   Highest education level: Not on file  Occupational History   Not on file  Tobacco Use   Smoking status: Never   Smokeless tobacco: Never  Vaping Use   Vaping status: Never Used  Substance and Sexual Activity   Alcohol use: No   Drug use: No   Sexual activity: Yes    Birth control/protection:  Surgical    Comment: hyst  Other Topics Concern   Not on file  Social History Narrative   Not on file   Social Determinants of Health   Financial Resource Strain: Low Risk  (03/10/2023)   Overall Financial Resource Strain (CARDIA)    Difficulty of Paying Living Expenses: Not hard at all  Food Insecurity: No Food Insecurity (03/10/2023)   Hunger Vital Sign    Worried About Running Out of Food in the Last Year: Never true    Ran Out of  Food in the Last Year: Never true  Transportation Needs: No Transportation Needs (03/10/2023)   PRAPARE - Administrator, Civil Service (Medical): No    Lack of Transportation (Non-Medical): No  Physical Activity: Sufficiently Active (03/10/2023)   Exercise Vital Sign    Days of Exercise per Week: 6 days    Minutes of Exercise per Session: 30 min  Stress: No Stress Concern Present (03/10/2023)   [employer] of Occupational Health - Occupational Stress Questionnaire    Feeling of Stress : Only a little  Social Connections: Moderately Integrated (03/10/2023)   Social Connection and Isolation Panel [NHANES]    Frequency of Communication with Friends and Family: More than three times a week    Frequency of Social Gatherings with Friends and Family: More than three times a week    Attends Religious Services: More than 4 times per year    Active Member of [employer] or Organizations: No    Attends Banker Meetings: Never    Marital Status: Married   Review of Systems   Gen: Denies fever, chills, anorexia. Denies fatigue, weakness, weight loss.  CV: Denies chest pain, palpitations, syncope, peripheral edema, and claudication. Resp: Denies dyspnea at rest, cough, wheezing, coughing up blood, and pleurisy. GI: See HPI Derm: Denies rash, itching, dry skin Psych: Denies depression, anxiety, memory loss, confusion. No homicidal or suicidal ideation.  Heme: Denies bruising, bleeding, and enlarged lymph nodes.  Physical Exam   There  were no vitals taken for this visit.  General:   Alert and oriented. No distress noted. Pleasant and cooperative.  Head:  Normocephalic and atraumatic. Eyes:  Conjuctiva clear without scleral icterus. Mouth:  Oral mucosa pink and moist. Good dentition. No lesions. Lungs:  Clear to auscultation bilaterally. No wheezes, rales, or rhonchi. No distress.  Heart:  S1, S2 present without murmurs appreciated.  Abdomen:  +BS, soft, non-tender and non-distended. No rebound or guarding. No HSM or masses noted. Rectal: *** Msk:  Symmetrical without gross deformities. Normal posture. Extremities:  Without edema. Neurologic:  Alert and  oriented x4 Psych:  Alert and cooperative. Normal mood and affect.  Assessment  Jasmine Lawson is a 62 y.o. female with a history of IBS (presumed diarrhea), arthritis, HTN, hypothyroidism, and stress urinary incontinence presenting today for evaluation of IBS. ***  IBS-Diarrhea:  Hx of colon polyps: Polypoid tissue with benign colonic mucosa noted on last colonoscopy in May 2015.  10-year repeat recommended.  Recall for surveillance May 2025.  PLAN   *** Bentyl vs levsin? IBS diet Recall for colonoscopy May 2015    Brooke Bonito, MSN, FNP-BC, AGACNP-BC [facility]

## 2023-11-02 ENCOUNTER — Encounter: Payer: Self-pay | Admitting: Gastroenterology

## 2023-11-02 ENCOUNTER — Ambulatory Visit (INDEPENDENT_AMBULATORY_CARE_PROVIDER_SITE_OTHER): Payer: Commercial Managed Care - PPO | Admitting: Gastroenterology

## 2023-11-02 VITALS — BP 152/84 | HR 96 | Temp 97.5°F | Ht 65.0 in | Wt 234.6 lb

## 2023-11-02 DIAGNOSIS — Z860100 Personal history of colon polyps, unspecified: Secondary | ICD-10-CM | POA: Diagnosis not present

## 2023-11-02 DIAGNOSIS — R1310 Dysphagia, unspecified: Secondary | ICD-10-CM | POA: Diagnosis not present

## 2023-11-02 DIAGNOSIS — K580 Irritable bowel syndrome with diarrhea: Secondary | ICD-10-CM

## 2023-11-02 MED ORDER — DICYCLOMINE HCL 10 MG PO CAPS: 10.0000 mg | ORAL_CAPSULE | Freq: Two times a day (BID) | ORAL | 4 refills | Status: AC | PRN

## 2023-11-02 NOTE — Patient Instructions (Signed)
I have [facility] for [facility].  Continue to take 1 tablet as needed up to twice a day for abdominal cramping.  If you begin having any significant constipation, dry mouth, or headaches please stop taking it and let me know.  As we discussed I would recommend that you try taking Lactaid tablets with any dairy products that you consume as I suspect you are lactose intolerant.  I will see you for follow-up in 6 months to see how you are doing.  At that time we can discuss your colonoscopy as well as possible upper endoscopy.  It was a pleasure to meet you today!  I hope you have a Altamese Cabal Christmas and a happy new year!  I want to create trusting relationships with patients. If you receive a survey regarding your visit,  I greatly appreciate you taking time to fill this out on paper or through your MyChart. I value your feedback.  Brooke Bonito, MSN, FNP-BC, AGACNP-BC [facility]

## 2023-11-03 ENCOUNTER — Other Ambulatory Visit: Payer: Self-pay | Admitting: Adult Health

## 2023-11-03 MED ORDER — FLUCONAZOLE 150 MG PO TABS: ORAL_TABLET | ORAL | 2 refills | Status: AC

## 2023-11-03 MED ORDER — CLOTRIMAZOLE-BETAMETHASONE 1-0.05 % EX CREA: 1.0000 | TOPICAL_CREAM | Freq: Two times a day (BID) | CUTANEOUS | 1 refills | Status: AC

## 2023-11-03 NOTE — Progress Notes (Signed)
Refilled Lotrisone and rx diflucan

## 2024-03-02 ENCOUNTER — Encounter (INDEPENDENT_AMBULATORY_CARE_PROVIDER_SITE_OTHER): Payer: Self-pay | Admitting: *Deleted

## 2024-03-28 ENCOUNTER — Telehealth (INDEPENDENT_AMBULATORY_CARE_PROVIDER_SITE_OTHER): Payer: Self-pay | Admitting: Gastroenterology

## 2024-03-28 DIAGNOSIS — R1310 Dysphagia, unspecified: Secondary | ICD-10-CM

## 2024-03-28 DIAGNOSIS — Z860100 Personal history of colon polyps, unspecified: Secondary | ICD-10-CM

## 2024-03-28 NOTE — Telephone Encounter (Signed)
 Who is your primary care physician: Dr.Sasser Dayspring  Reasons for the colonoscopy:   Have you had a colonoscopy before?  Yes 10 year ago  Do you have family history of colon cancer? Yes nephew and sister pancreatic cancer  Previous colonoscopy with polyps removed? yes  Do you have a history colorectal cancer?   no  Are you diabetic? If yes, Type 1 or Type 2?    no  Do you have a prosthetic or mechanical heart valve? no  Do you have a pacemaker/defibrillator?   no  Have you had endocarditis/atrial fibrillation? no  Have you had joint replacement within the last 12 months?  no  Do you tend to be constipated or have to use laxatives? no  Do you have any history of drugs or alchohol?  no  Do you use supplemental oxygen?  no  Have you had a stroke or heart attack within the last 6 months? no  Do you take weight loss medication?  no  For female patients: have you had a hysterectomy?  yes                                     are you post menopausal?       no                                            do you still have your menstrual cycle? no      Do you take any blood-thinning medications such as: (aspirin, warfarin, Plavix, Aggrenox)  no  If yes we need the name, milligram, dosage and who is prescribing doctor  Current Outpatient Medications on File Prior to Visit  Medication Sig Dispense Refill   cetirizine (ZYRTEC) 10 MG tablet Take 10 mg by mouth at bedtime.     dicyclomine  (BENTYL ) 10 MG capsule Take 1 capsule (10 mg total) by mouth 2 (two) times daily as needed for spasms. 60 capsule 4   famotidine (PEPCID) 10 MG tablet Take 10 mg by mouth at bedtime.     fluconazole  (DIFLUCAN ) 150 MG tablet Take 1 now and repeat 1 in 3 days (Patient not taking: Reported on 03/28/2024) 2 tablet 2   levothyroxine (SYNTHROID) 75 MCG tablet Take 75 mcg by mouth daily before breakfast. On brand name     montelukast (SINGULAIR) 10 MG tablet Take 10 mg by mouth at bedtime.     OVER THE COUNTER  MEDICATION Systane eye drops bid     spironolactone (ALDACTONE) 25 MG tablet Take 12.5 mg by mouth daily.      XIIDRA 5 % SOLN Apply 1 drop to eye 2 (two) times daily.     acetaminophen  (TYLENOL ) 500 MG tablet Take 500 mg by mouth every 6 (six) hours as needed. (Patient not taking: Reported on 03/28/2024)     albuterol (VENTOLIN HFA) 108 (90 Base) MCG/ACT inhaler Inhale 2 puffs into the lungs every 6 (six) hours as needed. (Patient not taking: Reported on 03/28/2024)     clotrimazole -betamethasone  (LOTRISONE ) cream Apply 1 Application topically 2 (two) times daily. (Patient not taking: Reported on 03/28/2024) 45 g 1   conjugated estrogens (PREMARIN) vaginal cream Place 1 Applicatorful vaginally. Patient states that she uses once a week. (Patient not taking: Reported on 03/28/2024)     No  current facility-administered medications on file prior to visit.    Allergies  Allergen Reactions   Codeine Other (See Comments)    Vomiting.   Levothyroxine Sodium Other (See Comments)    Leg pain   Lisinopril Other (See Comments)    Tongue swelling     Pharmacy: [facility]  Primary Insurance Name: [employer]  Best number where you can be reached: [phone]

## 2024-03-28 NOTE — Telephone Encounter (Signed)
 Room 1 Thanks

## 2024-03-29 NOTE — Telephone Encounter (Signed)
 Will call with June Schedule

## 2024-04-03 MED ORDER — PEG 3350-KCL-NA BICARB-NACL 420 G PO SOLR: 4000.0000 mL | Freq: Once | ORAL | 0 refills | Status: AC

## 2024-04-03 NOTE — Addendum Note (Signed)
 Addended by: Jalin Alicea on: 04/03/2024 04:25 PM   Modules accepted: Orders

## 2024-04-03 NOTE — Telephone Encounter (Signed)
 Pt contacted. Pt states she is still having issues with bread. Scheduled TCS and EGD +/- ED per [facility]. Instructions sent to pt. Prep sent to pharmacy. PA submitted and pending via [employer].

## 2024-04-03 NOTE — Telephone Encounter (Signed)
 Questionnaire from recall, no referral needed

## 2024-04-03 NOTE — Telephone Encounter (Signed)
 Pt seen back in December and in notes it states TCS in May-possible EGD at that time. Would you like me to schedule TCS and EGD?

## 2024-04-04 ENCOUNTER — Other Ambulatory Visit (INDEPENDENT_AMBULATORY_CARE_PROVIDER_SITE_OTHER): Payer: Self-pay | Admitting: Gastroenterology

## 2024-04-04 ENCOUNTER — Telehealth (INDEPENDENT_AMBULATORY_CARE_PROVIDER_SITE_OTHER): Payer: Self-pay | Admitting: Gastroenterology

## 2024-04-04 MED ORDER — CLENPIQ 10-3.5-12 MG-GM -GM/175ML PO SOLN: 1.0000 | ORAL | 0 refills | Status: DC

## 2024-04-04 NOTE — Telephone Encounter (Signed)
 Pt is scheduled colonoscopy with Dr Sammi Crick on 6/11 and has questions about taking a pill prep instead of liquid prep. [phone]

## 2024-04-04 NOTE — Telephone Encounter (Signed)
 Pt contacted. Pt states Dr.Rehman done her procedure last time and told her to request something different next time due to unable to keep liquid down. Advised pt that provider does not recommend tabs but I could send in a low volume prep. Pt verbalized understanding. Clenpiq sent to pharmacy and new instructions will be sent via mail.

## 2024-04-13 NOTE — Telephone Encounter (Signed)
 Pt called in. Unable to afford prep but can't tolerate the other prep. Sample left for pick up at [address]

## 2024-04-28 ENCOUNTER — Other Ambulatory Visit (HOSPITAL_COMMUNITY)
Admission: RE | Admit: 2024-04-28 | Discharge: 2024-04-28 | Disposition: A | Discharge status: Home / Self Care | Source: Ambulatory Visit | Attending: Gastroenterology | Admitting: Gastroenterology

## 2024-04-28 DIAGNOSIS — R1310 Dysphagia, unspecified: Secondary | ICD-10-CM | POA: Insufficient documentation

## 2024-04-28 DIAGNOSIS — Z860100 Personal history of colon polyps, unspecified: Secondary | ICD-10-CM | POA: Insufficient documentation

## 2024-04-28 LAB — BASIC METABOLIC PANEL WITH GFR
Anion gap: 8 (ref 5–15)
BUN: 13 mg/dL (ref 8–23)
CO2: 25 mmol/L (ref 22–32)
Calcium: 9.1 mg/dL (ref 8.9–10.3)
Chloride: 103 mmol/L (ref 98–111)
Creatinine, Ser: 0.56 mg/dL (ref 0.44–1.00)
GFR, Estimated: >60 mL/min (ref >60)
Glucose, Bld: 96 mg/dL (ref 70–99)
Potassium: 3.5 mmol/L (ref 3.5–5.1)
Sodium: 136 mmol/L (ref 135–145)

## 2024-05-03 ENCOUNTER — Encounter (HOSPITAL_COMMUNITY)
Admission: RE | Disposition: A | Discharge status: Home / Self Care | Payer: Self-pay | Source: Home / Self Care | Attending: Gastroenterology

## 2024-05-03 ENCOUNTER — Ambulatory Visit (HOSPITAL_COMMUNITY)
Admission: RE | Admit: 2024-05-03 | Discharge: 2024-05-03 | Disposition: A | Discharge status: Home / Self Care | Attending: Gastroenterology | Admitting: Gastroenterology

## 2024-05-03 ENCOUNTER — Other Ambulatory Visit: Payer: Self-pay

## 2024-05-03 ENCOUNTER — Ambulatory Visit (HOSPITAL_COMMUNITY): Admitting: Anesthesiology

## 2024-05-03 ENCOUNTER — Encounter (HOSPITAL_COMMUNITY): Payer: Self-pay | Admitting: Gastroenterology

## 2024-05-03 DIAGNOSIS — E039 Hypothyroidism, unspecified: Secondary | ICD-10-CM | POA: Insufficient documentation

## 2024-05-03 DIAGNOSIS — K2289 Other specified disease of esophagus: Secondary | ICD-10-CM

## 2024-05-03 DIAGNOSIS — K5730 Diverticulosis of large intestine without perforation or abscess without bleeding: Secondary | ICD-10-CM | POA: Diagnosis not present

## 2024-05-03 DIAGNOSIS — N3010 Interstitial cystitis (chronic) without hematuria: Secondary | ICD-10-CM | POA: Diagnosis not present

## 2024-05-03 DIAGNOSIS — Z1211 Encounter for screening for malignant neoplasm of colon: Secondary | ICD-10-CM | POA: Insufficient documentation

## 2024-05-03 DIAGNOSIS — R1319 Other dysphagia: Secondary | ICD-10-CM

## 2024-05-03 DIAGNOSIS — I10 Essential (primary) hypertension: Secondary | ICD-10-CM | POA: Diagnosis not present

## 2024-05-03 DIAGNOSIS — D123 Benign neoplasm of transverse colon: Secondary | ICD-10-CM | POA: Insufficient documentation

## 2024-05-03 DIAGNOSIS — R1310 Dysphagia, unspecified: Secondary | ICD-10-CM | POA: Diagnosis not present

## 2024-05-03 DIAGNOSIS — Z79890 Hormone replacement therapy (postmenopausal): Secondary | ICD-10-CM | POA: Diagnosis not present

## 2024-05-03 DIAGNOSIS — K6389 Other specified diseases of intestine: Secondary | ICD-10-CM | POA: Diagnosis not present

## 2024-05-03 DIAGNOSIS — K648 Other hemorrhoids: Secondary | ICD-10-CM | POA: Diagnosis not present

## 2024-05-03 HISTORY — PX: ESOPHAGOGASTRODUODENOSCOPY: SHX5428

## 2024-05-03 HISTORY — PX: ESOPHAGEAL DILATION: SHX303

## 2024-05-03 HISTORY — PX: COLONOSCOPY: SHX5424

## 2024-05-03 LAB — HM COLONOSCOPY

## 2024-05-03 SURGERY — COLONOSCOPY
Anesthesia: General

## 2024-05-03 MED ORDER — LIDOCAINE 2% (20 MG/ML) 5 ML SYRINGE
INTRAMUSCULAR | Status: DC | PRN
  Administered 2024-05-03: 100 mg via INTRAVENOUS

## 2024-05-03 MED ORDER — OMEPRAZOLE 40 MG PO CPDR: 40.0000 mg | DELAYED_RELEASE_CAPSULE | Freq: Every day | ORAL | 3 refills | Status: AC

## 2024-05-03 MED ORDER — LACTATED RINGERS IV SOLN
INTRAVENOUS | Status: DC
  Administered 2024-05-03: 500 mL via INTRAVENOUS

## 2024-05-03 MED ORDER — DEXMEDETOMIDINE HCL IN NACL 200 MCG/50ML IV SOLN
INTRAVENOUS | Status: DC | PRN
  Administered 2024-05-03 (×2): 4 ug via INTRAVENOUS

## 2024-05-03 MED ORDER — PROPOFOL 10 MG/ML IV BOLUS
INTRAVENOUS | Status: DC | PRN
  Administered 2024-05-03: 150 ug/kg/min via INTRAVENOUS
  Administered 2024-05-03: 50 mg via INTRAVENOUS
  Administered 2024-05-03: 20 mg via INTRAVENOUS

## 2024-05-03 NOTE — H&P (Signed)
 Jasmine Lawson is an 63 y.o. female.   Chief Complaint: Screening colonoscopy and dysphagia HPI: 63 year old female with past medical history of hypertension, hypothyroidism, interstitial cystitis, who came for evaluation of dysphagia and for screening colonoscopy.  Has had some intermittent dysphagia with eating solids such as bread.  Also has intermittent heartburn despite taking Pepcid. The patient denies having any nausea, vomiting, fever, chills, hematochezia, melena, hematemesis, abdominal distention, abdominal pain, diarrhea, jaundice, pruritus or weight loss.   Past Medical History:  Diagnosis Date   Arthritis 06/25/2021   legs feet and hands   Complication of anesthesia    COVID 11/2020   cold symptoms x 2 to 3 days all symtoms resolved   Hematuria 06/25/2021   Hypertension 06/25/2021   Hypothyroidism 06/25/2021   IC (interstitial cystitis)    Lumbar radiculopathy 06/25/2021   left side, last injection was 2020 per pt   Palpitations 06/25/2021   PONV (postoperative nausea and vomiting)    SUI (stress urinary incontinence, female) 06/25/2021   occ   Wears glasses 06/25/2021    Past Surgical History:  Procedure Laterality Date   ABDOMINAL HYSTERECTOMY  2001   bladder stretch  08/2022   cataract Bilateral 2023   CESAREAN SECTION     x 2   CHOLECYSTECTOMY     non-functioning by Dr. Florette Hurry yrs ago laparoscopic 20 yrs ago per pt   COLONOSCOPY N/A 04/12/2014   Procedure: COLONOSCOPY;  Surgeon: Ruby Corporal, MD;  Location: [facility];  Service: Endoscopy;  Laterality: N/A;  225 - moved to 730 - Ann notified pt   CYSTO WITH HYDRODISTENSION Bilateral 07/01/2021   Procedure: CYSTOSCOPY/HYDRODISTENSION INSTILLATION OF MARCAINE  AND PYRIDIUM  BILATERAL RETROGRADES;  Surgeon: Homero Luster, MD;  Location: [facility];  Service: Urology;  Laterality: Bilateral;   Fissure tear repair     many yrs ago per pt on 06-25-2021   Rt rotator cuff repair Right    15 to 20  yrs ago per pt on 06-25-2021    Family History  Problem Relation Age of Onset   Heart failure Mother    Deep vein thrombosis Mother    CAD Father    Cancer Sister        pancreatic   Thyroid  disease Daughter    Thyroid  disease Daughter    Cancer Other        maternal nephew-pancreatic   Pancreatic cancer Nephew    Colon cancer Neg Hx    Social History:  reports that she has never smoked. She has never used smokeless tobacco. She reports that she does not drink alcohol and does not use drugs.  Allergies:  Allergies  Allergen Reactions   Codeine Other (See Comments)    Vomiting.   Levothyroxine Sodium Other (See Comments)    Leg pain   Lisinopril Other (See Comments)    Tongue swelling    Medications Prior to Admission  Medication Sig Dispense Refill   cetirizine (ZYRTEC) 10 MG tablet Take 10 mg by mouth at bedtime.     cholecalciferol (VITAMIN D3) 25 MCG (1000 UNIT) tablet Take 1,000 Units by mouth daily. Takes 2 tablets per day     diclofenac (VOLTAREN) 75 MG EC tablet Take 75 mg by mouth 2 (two) times daily.     famotidine (PEPCID) 10 MG tablet Take 10 mg by mouth at bedtime.     fluconazole  (DIFLUCAN ) 150 MG tablet Take 1 now and repeat 1 in 3 days (Patient not taking: Reported on  03/28/2024) 2 tablet 2   levothyroxine (SYNTHROID) 75 MCG tablet Take 75 mcg by mouth daily before breakfast. On brand name     montelukast (SINGULAIR) 10 MG tablet Take 10 mg by mouth at bedtime.     OVER THE COUNTER MEDICATION Systane eye drops bid     Sod Picosulfate-Mag Ox-Cit Acd (CLENPIQ ) 10-3.5-12 MG-GM -GM/175ML SOLN Take 1 kit by mouth as directed. 350 mL 0   spironolactone (ALDACTONE) 25 MG tablet Take 12.5 mg by mouth daily.      vitamin B-12 (CYANOCOBALAMIN) 500 MCG tablet Take 500 mcg by mouth daily.     XIIDRA 5 % SOLN Apply 1 drop to eye 2 (two) times daily.     acetaminophen  (TYLENOL ) 500 MG tablet Take 500 mg by mouth every 6 (six) hours as needed. (Patient not taking: Reported on  03/28/2024)     albuterol (VENTOLIN HFA) 108 (90 Base) MCG/ACT inhaler Inhale 2 puffs into the lungs every 6 (six) hours as needed. (Patient not taking: Reported on 03/28/2024)     clotrimazole -betamethasone  (LOTRISONE ) cream Apply 1 Application topically 2 (two) times daily. (Patient not taking: Reported on 03/28/2024) 45 g 1   conjugated estrogens (PREMARIN) vaginal cream Place 1 Applicatorful vaginally. Patient states that she uses once a week. (Patient not taking: Reported on 03/28/2024)     dicyclomine  (BENTYL ) 10 MG capsule Take 1 capsule (10 mg total) by mouth 2 (two) times daily as needed for spasms. 60 capsule 4    No results found for this or any previous visit (from the past 48 hours). No results found.  Review of Systems  HENT:  Positive for trouble swallowing.   All other systems reviewed and are negative.   Blood pressure (!) 153/86, pulse 88, temperature 98.4 F (36.9 C), temperature source Oral, resp. rate 14, height 5' 5 (1.651 m), weight 101.2 kg, SpO2 96%. Physical Exam  GENERAL: The patient is AO x3, in no acute distress. HEENT: Head is normocephalic and atraumatic. EOMI are intact. Mouth is well hydrated and without lesions. NECK: Supple. No masses LUNGS: Clear to auscultation. No presence of rhonchi/wheezing/rales. Adequate chest expansion HEART: RRR, normal s1 and s2. ABDOMEN: Soft, nontender, no guarding, no peritoneal signs, and nondistended. BS +. No masses. EXTREMITIES: Without any cyanosis, clubbing, rash, lesions or edema. NEUROLOGIC: AOx3, no focal motor deficit. SKIN: no jaundice, no rashes  Assessment/Plan 63 year old female with past medical history of hypertension, hypothyroidism, interstitial cystitis, who came for evaluation of dysphagia and for screening colonoscopy.  Will proceed with EGD and colonoscopy.  Urban Garden, MD 05/03/2024, 10:12 AM

## 2024-05-03 NOTE — Anesthesia Procedure Notes (Signed)
 Procedure Name: MAC Date/Time: 05/03/2024 11:15 AM  Performed by: Manuela Sella, CRNAPre-anesthesia Checklist: Patient identified, Emergency Drugs available, Suction available and Patient being monitored Patient Re-evaluated:Patient Re-evaluated prior to induction Oxygen Delivery Method: Simple face mask Preoxygenation: Pre-oxygenation with 100% oxygen Placement Confirmation: positive ETCO2 and breath sounds checked- equal and bilateral Dental Injury: Teeth and Oropharynx as per pre-operative assessment

## 2024-05-03 NOTE — Anesthesia Preprocedure Evaluation (Signed)
 Anesthesia Evaluation  Patient identified by MRN, date of birth, ID band Patient awake    Reviewed: Allergy & Precautions, NPO status , Patient's Chart, lab work & pertinent test results  History of Anesthesia Complications (+) PONV and history of anesthetic complications  Airway Mallampati: II  TM Distance: >3 FB     Dental no notable dental hx. (+) Dental Advisory Given, Teeth Intact   Pulmonary    Pulmonary exam normal breath sounds clear to auscultation       Cardiovascular hypertension, Normal cardiovascular exam Rhythm:Regular Rate:Normal     Neuro/Psych  Neuromuscular disease    GI/Hepatic negative GI ROS, Neg liver ROS,,,  Endo/Other  Hypothyroidism    Renal/GU IC     Musculoskeletal  (+) Arthritis ,    Abdominal   Peds  Hematology   Anesthesia Other Findings   Reproductive/Obstetrics                             Anesthesia Physical Anesthesia Plan  ASA: 2  Anesthesia Plan: General   Post-op Pain Management: Minimal or no pain anticipated   Induction: Intravenous  PONV Risk Score and Plan: Propofol  infusion  Airway Management Planned: Natural Airway and Nasal Cannula  Additional Equipment: None  Intra-op Plan:   Post-operative Plan:   Informed Consent: I have reviewed the patients History and Physical, chart, labs and discussed the procedure including the risks, benefits and alternatives for the proposed anesthesia with the patient or authorized representative who has indicated his/her understanding and acceptance.       Plan Discussed with: CRNA  Anesthesia Plan Comments:         Anesthesia Quick Evaluation

## 2024-05-03 NOTE — Op Note (Signed)
 [facility] Patient Name: Jasmine Lawson Procedure Date: 05/03/2024 11:02 AM MRN: [id number] Date of Birth: 06/03/1961 Attending MD: Samantha Cress , , [id number] CSN: [id number] Age: 63 Admit Type: Outpatient Procedure:                Colonoscopy Indications:              Screening for colorectal malignant neoplasm Providers:                Samantha Cress, Alisa App, Willena Harp                            Annell Barrow Referring MD:              Medicines:                Monitored Anesthesia Care Complications:            No immediate complications. Estimated Blood Loss:     Estimated blood loss: none. Procedure:                Pre-Anesthesia Assessment:                           - Prior to the procedure, a History and Physical                            was performed, and patient medications, allergies                            and sensitivities were reviewed. The patient's                            tolerance of previous anesthesia was reviewed.                           - The risks and benefits of the procedure and the                            sedation options and risks were discussed with the                            patient. All questions were answered and informed                            consent was obtained.                           After obtaining informed consent, the colonoscope                            was passed under direct vision. Throughout the                            procedure, the patient's blood pressure, pulse, and                            oxygen saturations were monitored continuously. The  [id number]-HQ190L ([id number]) scope was introduced through                            the anus and advanced to the the terminal ileum.                            The colonoscopy was performed without difficulty.                            The patient tolerated the procedure well. The                            quality of the  bowel preparation was excellent. Scope In: 11:30:11 AM Scope Out: 11:45:06 AM Scope Withdrawal Time: 0 hours 12 minutes 17 seconds  Total Procedure Duration: 0 hours 14 minutes 55 seconds  Findings:      The perianal and digital rectal examinations were normal.      The ileocecal valve contained a few semi-sessile, non-bleeding nodules,       possibly benign lymph nodes. Biopsies were taken with a cold forceps for       histology.      A 3 mm polyp was found in the transverse colon. The polyp was sessile.       The polyp was removed with a cold snare. Resection and retrieval were       complete.      Multiple medium-mouthed and small-mouthed diverticula were found in the       sigmoid colon, descending colon and transverse colon.      Non-bleeding internal hemorrhoids were found during retroflexion. The       hemorrhoids were small. Impression:               - A few nodules at the ileocecal valve. Biopsied.                           - One 3 mm polyp in the transverse colon, removed                            with a cold snare. Resected and retrieved.                           - Diverticulosis in the sigmoid colon, in the                            descending colon and in the transverse colon.                           - Non-bleeding internal hemorrhoids. Moderate Sedation:      Per Anesthesia Care Recommendation:           - Discharge patient to home (ambulatory).                           - Resume previous diet.                           - Await pathology results.                           -  Repeat colonoscopy for surveillance based on                            pathology results. Procedure Code(s):        --- Professional ---                           [phone], Colonoscopy, flexible; with removal of                            tumor(s), polyp(s), or other lesion(s) by snare                            technique                           45380, 59, Colonoscopy, flexible; with biopsy,                             single or multiple Diagnosis Code(s):        --- Professional ---                           Z12.11, Encounter for screening for malignant                            neoplasm of colon                           D13.39, Benign neoplasm of other parts of small                            intestine                           D12.3, Benign neoplasm of transverse colon (hepatic                            flexure or splenic flexure)                           K64.8, Other hemorrhoids                           K57.30, Diverticulosis of large intestine without                            perforation or abscess without bleeding CPT copyright 2022 American Medical Association. All rights reserved. The codes documented in this report are preliminary and upon coder review may  be revised to meet current compliance requirements. Samantha Cress, MD Samantha Cress,  05/03/2024 11:54:13 AM This report has been signed electronically. Number of Addenda: 0

## 2024-05-03 NOTE — Anesthesia Postprocedure Evaluation (Signed)
 Anesthesia Post Note  Patient: Jasmine Lawson  Procedure(s) Performed: COLONOSCOPY EGD (ESOPHAGOGASTRODUODENOSCOPY) DILATION, ESOPHAGUS  Patient location during evaluation: Endoscopy Anesthesia Type: General Level of consciousness: awake and alert Pain management: pain level controlled Vital Signs Assessment: post-procedure vital signs reviewed and stable Respiratory status: spontaneous breathing, nonlabored ventilation and respiratory function stable Cardiovascular status: blood pressure returned to baseline and stable Postop Assessment: no apparent nausea or vomiting Anesthetic complications: no   There were no known notable events for this encounter.   Last Vitals:  Vitals:   05/03/24 1005 05/03/24 1149  BP: (!) 153/86 126/79  Pulse: 88 87  Resp: 14   Temp: 36.9 C 36.7 C  SpO2: 96% 97%    Last Pain:  Vitals:   05/03/24 1149  TempSrc: Oral  PainSc: 0-No pain                 Jennet Scroggin L Ruthellen Tippy

## 2024-05-03 NOTE — Discharge Instructions (Addendum)
 You are being discharged to home.  Resume your previous diet.  We are waiting for your pathology results.  Take Prilosec (omeprazole) 40 mg by mouth once a day.  Stop Pepcid. Your physician has recommended a repeat colonoscopy for surveillance based on pathology results.

## 2024-05-03 NOTE — Transfer of Care (Signed)
 Immediate Anesthesia Transfer of Care Note  Patient: Jasmine Lawson  Procedure(s) Performed: Procedure(s) with comments: COLONOSCOPY (N/A) - 11:15AM;ASA 1 EGD (ESOPHAGOGASTRODUODENOSCOPY) (N/A) - 11:15AM;ASA 1 DILATION, ESOPHAGUS (N/A) - 11:15AM;ASA 1  Patient Location: PACU  Anesthesia Type:General  Level of Consciousness: Patient easily awoken, comfortable, cooperative, following commands, responds to stimulation.   Airway & Oxygen Therapy: Patient spontaneously breathing, ventilating well, oxygen via simple oxygen mask.  Post-op Assessment: Report given to PACU RN, vital signs reviewed and stable, moving all extremities.   Post vital signs: Reviewed and stable.  Complications: No apparent anesthesia complications  Last Vitals:  Vitals Value Taken Time  BP 126/79 05/03/24 1149  Temp 36.7 C 05/03/24 1149  Pulse 87 05/03/24 1149  Resp    SpO2 97 % 05/03/24 1149    Last Pain:  Vitals:   05/03/24 1149  TempSrc: Oral  PainSc: 0-No pain      Patients Stated Pain Goal: 7 (05/03/24 1005)  Complications: No notable events documented.

## 2024-05-03 NOTE — Op Note (Signed)
 [facility] Patient Name: Jasmine Lawson Procedure Date: 05/03/2024 11:02 AM MRN: [id number] Date of Birth: [birth year] Attending MD: Samantha Cress , , [id number] CSN: [id number] Age: 63 Admit Type: Outpatient Procedure:                Upper GI endoscopy Indications:              Dysphagia Providers:                Samantha Cress, Willena Harp, Arvilla Birmingham Referring MD:              Medicines:                Monitored Anesthesia Care Complications:            No immediate complications. Estimated Blood Loss:     Estimated blood loss: none. Procedure:                Pre-Anesthesia Assessment:                           - Prior to the procedure, a History and Physical                            was performed, and patient medications, allergies                            and sensitivities were reviewed. The patient's                            tolerance of previous anesthesia was reviewed.                           - The risks and benefits of the procedure and the                            sedation options and risks were discussed with the                            patient. All questions were answered and informed                            consent was obtained.                           - ASA Grade Assessment: II - A patient with mild                            systemic disease.                           After obtaining informed consent, the endoscope was                            passed under direct vision. Throughout the  procedure, the patient's blood pressure, pulse, and                            oxygen saturations were monitored continuously. The                            [id number]-[id number] ([id number]) scope was introduced through the                            mouth, and advanced to the second part of duodenum.                            The upper GI endoscopy was accomplished without                             difficulty. The patient tolerated the procedure                            well. Scope In: 11:16:23 AM Scope Out: 11:24:07 AM Total Procedure Duration: 0 hours 7 minutes 44 seconds  Findings:      No endoscopic abnormality was evident in the esophagus to explain the       patient's complaint of dysphagia. It was decided, however, to proceed       with dilation of the entire esophagus. A guidewire was placed and the       scope was withdrawn. Dilation was performed with a Savary dilator with       no resistance at 18 mm. The dilation site was examined following       endoscope reinsertion and showed no change. Biopsies were taken with a       cold forceps for histology.      The stomach was normal.      The examined duodenum was normal. Impression:               - No endoscopic esophageal abnormality to explain                            patient's dysphagia. Esophagus dilated. Dilated.                            Biopsied.                           - Normal stomach.                           - Normal examined duodenum. Moderate Sedation:      Per Anesthesia Care Recommendation:           - Discharge patient to home (ambulatory).                           - Resume previous diet.                           - Await pathology results.                           -  Use Prilosec (omeprazole) 40 mg PO daily.                           - Stop Pepcid. Procedure Code(s):        --- Professional ---                           [phone], Esophagogastroduodenoscopy, flexible,                            transoral; with insertion of guide wire followed by                            passage of dilator(s) through esophagus over guide                            wire                           43239, 59, Esophagogastroduodenoscopy, flexible,                            transoral; with biopsy, single or multiple Diagnosis Code(s):        --- Professional ---                           R13.10, Dysphagia,  unspecified CPT copyright 2022 American Medical Association. All rights reserved. The codes documented in this report are preliminary and upon coder review may  be revised to meet current compliance requirements. Samantha Cress, MD Samantha Cress,  05/03/2024 11:29:22 AM This report has been signed electronically. Number of Addenda: 0

## 2024-05-04 ENCOUNTER — Encounter (INDEPENDENT_AMBULATORY_CARE_PROVIDER_SITE_OTHER): Payer: Self-pay | Admitting: *Deleted

## 2024-05-04 ENCOUNTER — Encounter (HOSPITAL_COMMUNITY): Payer: Self-pay | Admitting: Gastroenterology

## 2024-05-04 LAB — SURGICAL PATHOLOGY

## 2024-05-05 ENCOUNTER — Ambulatory Visit (INDEPENDENT_AMBULATORY_CARE_PROVIDER_SITE_OTHER): Payer: Self-pay | Admitting: Gastroenterology

## 2024-05-16 ENCOUNTER — Ambulatory Visit (INDEPENDENT_AMBULATORY_CARE_PROVIDER_SITE_OTHER): Payer: Commercial Managed Care - PPO | Admitting: Gastroenterology

## 2024-05-16 ENCOUNTER — Encounter: Payer: Self-pay | Admitting: Gastroenterology

## 2024-05-16 VITALS — BP 138/85 | HR 85 | Temp 97.7°F | Ht 65.0 in | Wt 238.4 lb

## 2024-05-16 DIAGNOSIS — R1310 Dysphagia, unspecified: Secondary | ICD-10-CM | POA: Diagnosis not present

## 2024-05-16 DIAGNOSIS — K219 Gastro-esophageal reflux disease without esophagitis: Secondary | ICD-10-CM

## 2024-05-16 DIAGNOSIS — Z860100 Personal history of colon polyps, unspecified: Secondary | ICD-10-CM

## 2024-05-16 DIAGNOSIS — K580 Irritable bowel syndrome with diarrhea: Secondary | ICD-10-CM

## 2024-05-16 DIAGNOSIS — Z860101 Personal history of adenomatous and serrated colon polyps: Secondary | ICD-10-CM | POA: Diagnosis not present

## 2024-05-16 NOTE — Patient Instructions (Signed)
 Continue omeprazole  40 mg once daily. Feel free to contact if refill needed.  Follow a GERD diet:  Avoid fried, fatty, greasy, spicy, citrus foods. Avoid caffeine and carbonated beverages. Avoid chocolate. Try eating 4-6 small meals a day rather than 3 large meals. Do not eat within 3 hours of laying down. Prop head of bed up on wood or bricks to create a 6 inch incline.  Continue dicyclomine  10 mg as needed for abdominal pain/diarrhea.   Have a wonderful summer - stay cool!!  See you in 1 year, sooner if needed.   It was a pleasure to see you today. I want to create trusting relationships with patients. If you receive a survey regarding your visit,  I greatly appreciate you taking time to fill this out on paper or through your MyChart. I value your feedback.  Charmaine Melia, MSN, FNP-BC, AGACNP-BC [facility]

## 2024-05-16 NOTE — Progress Notes (Addendum)
 GI Office Note    Referring Provider: Lari Elspeth BRAVO, MD Primary Care Physician:  Atilano Deward ORN, MD Primary Gastroenterologist: Toribio Rubins, MD  Date:  05/16/2024  ID:  Jasmine Lawson, DOB Jan 02, 1961, MRN [id number]  Chief Complaint   Chief Complaint  Patient presents with   Follow-up    Follow up. No problems    History of Present Illness  Jasmine Lawson is a 63 y.o. female with a history of IBS diarrhea, arthritis, HTN, hypothyroidism, stress urinary incontinence, and GERD presenting today for follow-up post procedures.  Colonoscopy May 2015: -Mild sigmoid colonic diverticulosis -Non-adenomatous polyps -10-year repeat recommended   Last office visit 08/24/2017.  Noted to have a history of IBS and was doing well on as needed dicyclomine  until September 2018 when she developed nausea and abdominal pain.  Pain primarily left-sided and it felt tightness and a tugging sensation.  She then developed a low-grade fever and had no appetite and did see some improvement with dicyclomine  but then had explosive diarrhea.  She had reported 5-7 bowel movements a day and went to the ER.  She had a CT scan at that time it was felt that she was having a flareup of IBS and was discharged to follow-up with GI.  At this time she was using dicyclomine  on schedule and was feeling better but not back to baseline.  Appetite slowly improving but still having discomfort in her left upper quadrant.  She was taking Tylenol  and/or Advil for pain which she felt was helping.  She was advised to continue dicyclomine  scheduled until back to baseline.  Advised no further workup of her left upper quadrant pain unless it were to get worse.  She was advised to continue Tylenol  and ibuprofen.   Telephone recommendations given in 2021 for fear of diverticulitis and diarrhea. She was given abx by PCP and instructed to take imodium no more than 6 mg in divided doses and to call if no improvement in 1 week.    Per  referral paperwork she had a visit with PCP 10/12/2023 with complaint of IBS symptoms.  She requested another referral to see GI given she has not been seen in 3 years.  She had noted some recent flares, sometimes notices call sometimes does not.  Reportedly taken dicyclomine  in the past.  Also noted a history of diverticulitis.  Has intermittent left-sided abdominal pain.  Last office visit 11/02/2023.  Jasmine Lawson have been bothering her.  Reported history of IBS given within 10 to 15 minutes of eating she feels lower abdominal pain.  Had been taking dicyclomine .  If she does not improve she will take an additional dose of dicyclomine .  Tries to avoid dairy products.  Denied constipation.  Stools loose but not watery.  Going 1-2 times daily.  Had some issues with dehydration in the past.  Stools occasionally look oily.  Felt like she was having trouble eating especially with bread, no issues with medications or liquids.  Given Bentyl  as needed.  Advise IBS diet, Lactaid with dairy products.  Placed on recall for colonoscopy in May 2025 with possible EGD at same time.  EGD - No endoscopic esophageal abnormality to explain patient' s dysphagia. S/p dilation and biopsied - Normal stomach. - Pathology: Focal spongiosis and changes compatible with reflux in the esophagus.   - Advised omeprazole  40 mg daily.  Colonoscopy: - A few nodules at the ileocecal valve. Biopsied.  - One 3 mm polyp in the transverse colon,  removed with a cold snare - Diverticulosis in the sigmoid colon, in the descending colon and in the transverse colon.  - Non- bleeding internal hemorrhoids. - Pathology with tubular adenoma. Prominent reactive lymphoid aggregate (small bowel nodule) - Advise repeat in 7 years.  Today:  After starting the omeprazole  and the stretching her esophagus she is eating well. Eating bread is much better and scratching feeling is better also.  No side from omeprazole .   Diarrhea has been better.  Does not feel like the current probiotic had been helpful - doing okay without it. Only used dicyclomine  here and there. Can determine there are certain things he eats that makes her left sided pain/discomfort worse. Peanuts/popcorn she has some discomfort so she stays away from it. Has it on hand in case but has not had to use it.   Has had leg cramping for about the last year in her legs.   Doing B12 and Vitamin D supplement snow but still having some leg cramps.   Wt Readings from Last 3 Encounters:  05/16/24 238 lb 6.4 oz (108.1 kg)  05/03/24 223 lb (101.2 kg)  11/02/23 234 lb 9.6 oz (106.4 kg)    Current Outpatient Medications  Medication Sig Dispense Refill   acetaminophen  (TYLENOL ) 500 MG tablet Take 500 mg by mouth every 6 (six) hours as needed.     albuterol (VENTOLIN HFA) 108 (90 Base) MCG/ACT inhaler Inhale 2 puffs into the lungs every 6 (six) hours as needed.     cetirizine (ZYRTEC) 10 MG tablet Take 10 mg by mouth at bedtime.     cholecalciferol (VITAMIN D3) 25 MCG (1000 UNIT) tablet Take 1,000 Units by mouth daily. Takes 2 tablets per day     dicyclomine  (BENTYL ) 10 MG capsule Take 1 capsule (10 mg total) by mouth 2 (two) times daily as needed for spasms. 60 capsule 4   fluconazole  (DIFLUCAN ) 150 MG tablet Take 1 now and repeat 1 in 3 days (Patient not taking: Reported on 05/16/2024) 2 tablet 2   levothyroxine (SYNTHROID) 75 MCG tablet Take 75 mcg by mouth daily before breakfast. On brand name     montelukast (SINGULAIR) 10 MG tablet Take 10 mg by mouth at bedtime.     omeprazole  (PRILOSEC) 40 MG capsule Take 1 capsule (40 mg total) by mouth daily. 90 capsule 3   OVER THE COUNTER MEDICATION Systane eye drops bid     spironolactone (ALDACTONE) 25 MG tablet Take 12.5 mg by mouth daily.      vitamin B-12 (CYANOCOBALAMIN) 500 MCG tablet Take 500 mcg by mouth daily.     XIIDRA 5 % SOLN Apply 1 drop to eye 2 (two) times daily.     clotrimazole -betamethasone  (LOTRISONE ) cream Apply 1  Application topically 2 (two) times daily. (Patient not taking: Reported on 05/16/2024) 45 g 1   conjugated estrogens (PREMARIN) vaginal cream Place 1 Applicatorful vaginally. Patient states that she uses once a week. (Patient not taking: Reported on 05/16/2024)     diclofenac (VOLTAREN) 75 MG EC tablet Take 75 mg by mouth 2 (two) times daily. (Patient not taking: Reported on 05/16/2024)     No current facility-administered medications for this visit.    Past Medical History:  Diagnosis Date   Arthritis 06/25/2021   legs feet and hands   Complication of anesthesia    COVID 11/2020   cold symptoms x 2 to 3 days all symtoms resolved   Hematuria 06/25/2021   Hypertension 06/25/2021   Hypothyroidism 06/25/2021  IC (interstitial cystitis)    Lumbar radiculopathy 06/25/2021   left side, last injection was 2020 per pt   Palpitations 06/25/2021   PONV (postoperative nausea and vomiting)    SUI (stress urinary incontinence, female) 06/25/2021   occ   Wears glasses 06/25/2021    Past Surgical History:  Procedure Laterality Date   ABDOMINAL HYSTERECTOMY  2001   bladder stretch  08/2022   cataract Bilateral 2023   CESAREAN SECTION     x 2   CHOLECYSTECTOMY     non-functioning by Dr. Barry yrs ago laparoscopic 20 yrs ago per pt   COLONOSCOPY N/A 04/12/2014   Procedure: COLONOSCOPY;  Surgeon: Claudis RAYMOND Rivet, MD;  Location: [facility];  Service: Endoscopy;  Laterality: N/A;  225 - moved to 730 - Ann notified pt   COLONOSCOPY N/A 05/03/2024   Procedure: COLONOSCOPY;  Surgeon: Eartha Angelia Sieving, MD;  Location: [facility];  Service: Gastroenterology;  Laterality: N/A;  11:15AM;ASA 1   CYSTO WITH HYDRODISTENSION Bilateral 07/01/2021   Procedure: CYSTOSCOPY/HYDRODISTENSION INSTILLATION OF MARCAINE  AND PYRIDIUM  BILATERAL RETROGRADES;  Surgeon: Watt Rush, MD;  Location: [facility];  Service: Urology;  Laterality: Bilateral;   ESOPHAGEAL DILATION N/A 05/03/2024    Procedure: DILATION, ESOPHAGUS;  Surgeon: Eartha Angelia Sieving, MD;  Location: [facility];  Service: Gastroenterology;  Laterality: N/A;  11:15AM;ASA 1   ESOPHAGOGASTRODUODENOSCOPY N/A 05/03/2024   Procedure: EGD (ESOPHAGOGASTRODUODENOSCOPY);  Surgeon: Eartha Angelia, Sieving, MD;  Location: [facility];  Service: Gastroenterology;  Laterality: N/A;  11:15AM;ASA 1   Fissure tear repair     many yrs ago per pt on 06-25-2021   Rt rotator cuff repair Right    15 to 20 yrs ago per pt on 06-25-2021    Family History  Problem Relation Age of Onset   Heart failure Mother    Deep vein thrombosis Mother    CAD Father    Cancer Sister        pancreatic   Thyroid  disease Daughter    Thyroid  disease Daughter    Cancer Other        maternal nephew-pancreatic   Pancreatic cancer Nephew    Colon cancer Neg Hx     Allergies as of 05/16/2024 - Review Complete 05/16/2024  Allergen Reaction Noted   Codeine Other (See Comments) 03/12/2014   Levothyroxine sodium Other (See Comments) 09/17/2020   Lisinopril Other (See Comments) 09/17/2020    Social History   Socioeconomic History   Marital status: Married    Spouse name: Not on file   Number of children: Not on file   Years of education: Not on file   Highest education level: Not on file  Occupational History   Not on file  Tobacco Use   Smoking status: Never   Smokeless tobacco: Never  Vaping Use   Vaping status: Never Used  Substance and Sexual Activity   Alcohol use: No   Drug use: No   Sexual activity: Yes    Birth control/protection: Surgical    Comment: hyst  Other Topics Concern   Not on file  Social History Narrative   Not on file   Social Drivers of Health   Financial Resource Strain: Low Risk  (03/10/2023)   Overall Financial Resource Strain (CARDIA)    Difficulty of Paying Living Expenses: Not hard at all  Food Insecurity: No Food Insecurity (03/10/2023)   Hunger Vital Sign    Worried About Running Out of  Food in the Last  Year: Never true    Ran Out of Food in the Last Year: Never true  Transportation Needs: No Transportation Needs (03/10/2023)   PRAPARE - Administrator, Civil Service (Medical): No    Lack of Transportation (Non-Medical): No  Physical Activity: Sufficiently Active (03/10/2023)   Exercise Vital Sign    Days of Exercise per Week: 6 days    Minutes of Exercise per Session: 30 min  Stress: No Stress Concern Present (03/10/2023)   [employer] of Occupational Health - Occupational Stress Questionnaire    Feeling of Stress : Only a little  Social Connections: Moderately Integrated (03/10/2023)   Social Connection and Isolation Panel    Frequency of Communication with Friends and Family: More than three times a week    Frequency of Social Gatherings with Friends and Family: More than three times a week    Attends Religious Services: More than 4 times per year    Active Member of [employer] or Organizations: No    Attends Banker Meetings: Never    Marital Status: Married     Review of Systems   Gen: Denies fever, chills, anorexia. Denies fatigue, weakness, weight loss.  CV: Denies chest pain, palpitations, syncope, peripheral edema, and claudication. Resp: Denies dyspnea at rest, cough, wheezing, coughing up blood, and pleurisy. GI: See HPI Derm: Denies rash, itching, dry skin Psych: Denies depression, anxiety, memory loss, confusion. No homicidal or suicidal ideation.  Heme: Denies bruising, bleeding, and enlarged lymph nodes.  Physical Exam   BP 138/85 (BP Location: Right Arm, Patient Position: Sitting, Cuff Size: Large)   Pulse 85   Temp 97.7 F (36.5 C) (Temporal)   Ht 5' 5 (1.651 m)   Wt 238 lb 6.4 oz (108.1 kg)   BMI 39.67 kg/m   General:   Alert and oriented. No distress noted. Pleasant and cooperative.  Head:  Normocephalic and atraumatic. Eyes:  Conjuctiva clear without scleral icterus. Mouth:  Oral mucosa pink and moist. Good  dentition. No lesions. Abdomen:  +BS, soft, non-tender and non-distended. No rebound or guarding. No HSM or masses noted. Rectal: deferred Msk:  Symmetrical without gross deformities. Normal posture. Extremities:  Without edema. Neurologic:  Alert and  oriented x4 Psych:  Alert and cooperative. Normal mood and affect.  Assessment  Jasmine Lawson is a 63 y.o. female presenting today for follow-up with no concerning symptoms.  IBS-D: - Symptoms fairly well-controlled right now with dietary avoidance of triggers - History of cholecystectomy, could still possibly have some bile acid component. - Has only used dicyclomine  as needed, will continue this. - Limit lactose also if causing symptoms. - Advised to continue dicyclomine  as needed for severe refractory symptoms. - Discussed that if she found a probiotic helpful that she could continue otherwise she can stop.  Dysphagia, GERD: - Recent EGD with normal esophagus s/p dilation.  Dysphagia has improved. - No longer having scratching feeling to her throat or trouble swallowing - Tolerating omeprazole  40 mg once daily before dinner well and controlling symptoms - No current changes to plan.  Encouraged GERD diet.  History of colon polyps: - Colonoscopy May 2015 with benign polypoid tissue - Colonoscopy May 2025 with tubular adenoma removed and benign small bowel nodule. - Due for repeat in 7 years.  PLAN   Omeprazole  40 mg once daily.  Famotidine as needed for breakthrough.  Dicyclomine  10 mg as needed.  GERD diet Okay for probiotic per patient preference. Follow up in 1 year.  Charmaine Melia, MSN, FNP-BC, AGACNP-BC [facility]  I have reviewed the note and agree with the APP's assessment as described in this progress note  Toribio Fortune, MD Gastroenterology and Hepatology [facility]

## 2024-07-21 ENCOUNTER — Other Ambulatory Visit (HOSPITAL_COMMUNITY): Payer: Self-pay | Admitting: Family Medicine

## 2024-07-21 DIAGNOSIS — I739 Peripheral vascular disease, unspecified: Secondary | ICD-10-CM

## 2024-07-27 ENCOUNTER — Ambulatory Visit (HOSPITAL_COMMUNITY)
Admission: RE | Admit: 2024-07-27 | Discharge: 2024-07-27 | Disposition: A | Discharge status: Home / Self Care | Source: Ambulatory Visit | Attending: Family Medicine | Admitting: Family Medicine

## 2024-07-27 DIAGNOSIS — I739 Peripheral vascular disease, unspecified: Secondary | ICD-10-CM | POA: Diagnosis present

## 2024-08-04 ENCOUNTER — Other Ambulatory Visit (HOSPITAL_COMMUNITY): Payer: Self-pay | Admitting: Family Medicine

## 2024-08-04 DIAGNOSIS — M62838 Other muscle spasm: Secondary | ICD-10-CM

## 2024-08-04 DIAGNOSIS — R936 Abnormal findings on diagnostic imaging of limbs: Secondary | ICD-10-CM

## 2024-08-18 ENCOUNTER — Ambulatory Visit (HOSPITAL_COMMUNITY)
Admission: RE | Admit: 2024-08-18 | Discharge: 2024-08-18 | Disposition: A | Discharge status: Home / Self Care | Source: Ambulatory Visit | Attending: Family Medicine | Admitting: Family Medicine

## 2024-08-18 DIAGNOSIS — R936 Abnormal findings on diagnostic imaging of limbs: Secondary | ICD-10-CM | POA: Insufficient documentation

## 2024-08-18 DIAGNOSIS — M62838 Other muscle spasm: Secondary | ICD-10-CM | POA: Insufficient documentation

## 2024-08-18 MED ORDER — IOHEXOL 350 MG/ML SOLN
100.0000 mL | Freq: Once | INTRAVENOUS | Status: AC | PRN
  Administered 2024-08-18: 100 mL via INTRAVENOUS

## 2024-09-06 ENCOUNTER — Encounter (INDEPENDENT_AMBULATORY_CARE_PROVIDER_SITE_OTHER): Payer: Self-pay | Admitting: Gastroenterology

## 2024-09-12 ENCOUNTER — Encounter (HOSPITAL_COMMUNITY): Payer: Self-pay

## 2024-09-12 ENCOUNTER — Ambulatory Visit (HOSPITAL_COMMUNITY)

## 2024-10-05 ENCOUNTER — Telehealth: Payer: Self-pay

## 2024-10-05 ENCOUNTER — Ambulatory Visit

## 2024-10-05 VITALS — BP 178/111 | HR 97 | Temp 98.0°F | Resp 13 | Ht 63.0 in | Wt 233.2 lb

## 2024-10-05 DIAGNOSIS — R29898 Other symptoms and signs involving the musculoskeletal system: Secondary | ICD-10-CM | POA: Diagnosis not present

## 2024-10-05 DIAGNOSIS — M79662 Pain in left lower leg: Secondary | ICD-10-CM | POA: Diagnosis not present

## 2024-10-05 DIAGNOSIS — M791 Myalgia, unspecified site: Secondary | ICD-10-CM

## 2024-10-05 DIAGNOSIS — M79661 Pain in right lower leg: Secondary | ICD-10-CM | POA: Diagnosis not present

## 2024-10-05 NOTE — Progress Notes (Signed)
 Office Visit Note  Patient: Jasmine Lawson             Date of Birth: Dec 26, 1960           MRN: [id number]             PCP: Atilano Deward ORN, MD Referring: Job Bolt, [place] Visit Date: 10/05/2024 Occupation: Data Unavailable  Subjective:  New Patient (Initial Visit) (Pain in legs and stiffness. )  Discussed the use of AI scribe software for clinical note transcription with the patient, who gave verbal consent to proceed.  History of Present Illness Jasmine Lawson is a 63 year old female who presents with progressive leg weakness and pain. She was referred by her primary doctor for evaluation of a positive autoimmune test.  She has experienced progressive pain in her legs over the past year, with symptoms worsening in the mornings and at night. The pain is primarily located in the back of her legs and calves, described as muscle soreness and charley horse-like cramps. She has experienced falls due to leg pain and weakness and recently needed to use a wheelchair for a short period after receiving gel shots for her knee. She has a history of low back pain with radiation to the legs. No recent rashes, photosensitivity, Raynaud's phenomenon, gout, or patchy hair loss. No dry mouth but reports dry eyes following cataract surgery.  She has a history of thyroid  issues. She has not been on cholesterol medication and has tried over-the-counter treatments like magnesium and turmeric with minimal improvement. She takes Tylenol  for pain and occasionally rotates with ibuprofen, though she is cautious about medication use due to past blood pressure issues. She has tried gel shots for her knee, which worsened her b/l muscle pain.  Her family history includes autoimmune diseases, with a sister who had rheumatoid arthritis and daughters with Crohn's disease and Hashimoto's. She drinks a lot of water  to manage interstitial cystitis and avoids drinking in the afternoon to prevent nighttime urinary frequency.    She cares for her grandchildren, a three-year-old and a four-year-old, which impacts her daily activities.    Activities of Daily Living:  Patient reports morning stiffness for 15 minutes.   Patient Reports nocturnal pain.  Difficulty dressing/grooming: Denies Difficulty climbing stairs: Reports Difficulty getting out of chair: Reports Difficulty using hands for taps, buttons, cutlery, and/or writing: Denies  Review of Systems  Constitutional:  Positive for fatigue.  HENT:  Negative for mouth sores and mouth dryness.   Eyes:  Positive for itching and dryness.  Respiratory:  Negative for shortness of breath.   Cardiovascular:  Negative for chest pain and palpitations.  Gastrointestinal:  Negative for blood in stool, constipation and diarrhea.  Endocrine: Positive for increased urination.  Genitourinary:  Negative for involuntary urination.  Musculoskeletal:  Positive for joint pain, gait problem, joint pain, joint swelling, myalgias, muscle weakness, morning stiffness, muscle tenderness and myalgias.  Skin:  Negative for color change, rash, hair loss and sensitivity to sunlight.  Allergic/Immunologic: Positive for susceptible to infections.  Neurological:  Negative for dizziness and headaches.  Hematological:  Negative for swollen glands.  Psychiatric/Behavioral:  Positive for sleep disturbance. Negative for depressed mood. The patient is not nervous/anxious.     PMFS History:  Patient Active Problem List   Diagnosis Date Noted   Esophageal dysphagia 05/03/2024   Special screening for malignant neoplasms, colon 05/03/2024   Superficial fungus infection of skin 03/10/2023   Hematuria 03/10/2023   Vaginal discharge 03/10/2023  Hypothyroidism 03/12/2014   Essential hypertension, benign 03/12/2014   Abdominal pain, left lower quadrant 03/12/2014   Lt flank pain 03/12/2014    Past Medical History:  Diagnosis Date   Arthritis 06/25/2021   legs feet and hands   Complication  of anesthesia    COVID 11/2020   cold symptoms x 2 to 3 days all symtoms resolved   Hematuria 06/25/2021   Hypertension 06/25/2021   Hypothyroidism 06/25/2021   IC (interstitial cystitis)    Lumbar radiculopathy 06/25/2021   left side, last injection was 2020 per pt   Palpitations 06/25/2021   PONV (postoperative nausea and vomiting)    SUI (stress urinary incontinence, female) 06/25/2021   occ   Wears glasses 06/25/2021    Family History  Problem Relation Age of Onset   Heart failure Mother    Deep vein thrombosis Mother    CAD Father    Cancer Sister        pancreatic   Rheum arthritis Sister    Fibromyalgia Sister    Atrial fibrillation Sister    Osteoarthritis Brother    Thyroid  disease Daughter    Crohn's disease Daughter    Thyroid  disease Daughter    Hashimoto's thyroiditis Daughter    Pancreatic cancer Nephew    Cancer Other        maternal nephew-pancreatic   Colon cancer Neg Hx    Past Surgical History:  Procedure Laterality Date   ABDOMINAL HYSTERECTOMY  2001   bladder stretch  08/2022   cataract Bilateral 2023   CESAREAN SECTION     x 2   CHOLECYSTECTOMY     non-functioning by Dr. Barry yrs ago laparoscopic 20 yrs ago per pt   COLONOSCOPY N/A 04/12/2014   Procedure: COLONOSCOPY;  Surgeon: Claudis RAYMOND Rivet, MD;  Location: [facility];  Service: Endoscopy;  Laterality: N/A;  225 - moved to 730 - Ann notified pt   COLONOSCOPY N/A 05/03/2024   Procedure: COLONOSCOPY;  Surgeon: Eartha Angelia Sieving, MD;  Location: [facility];  Service: Gastroenterology;  Laterality: N/A;  11:15AM;ASA 1   CYSTO WITH HYDRODISTENSION Bilateral 07/01/2021   Procedure: CYSTOSCOPY/HYDRODISTENSION INSTILLATION OF MARCAINE  AND PYRIDIUM  BILATERAL RETROGRADES;  Surgeon: Watt Rush, MD;  Location: [facility];  Service: Urology;  Laterality: Bilateral;   ESOPHAGEAL DILATION N/A 05/03/2024   Procedure: DILATION, ESOPHAGUS;  Surgeon: Eartha Angelia Sieving,  MD;  Location: [facility];  Service: Gastroenterology;  Laterality: N/A;  11:15AM;ASA 1   ESOPHAGOGASTRODUODENOSCOPY N/A 05/03/2024   Procedure: EGD (ESOPHAGOGASTRODUODENOSCOPY);  Surgeon: Eartha Angelia, Sieving, MD;  Location: [facility];  Service: Gastroenterology;  Laterality: N/A;  11:15AM;ASA 1   Fissure tear repair     many yrs ago per pt on 06-25-2021   Rt rotator cuff repair Right    15 to 20 yrs ago per pt on 06-25-2021   Social History   Tobacco Use   Smoking status: Never    Passive exposure: Past   Smokeless tobacco: Never  Vaping Use   Vaping status: Never Used  Substance Use Topics   Alcohol use: No   Drug use: No   Social History   Social History Narrative   Not on file      There is no immunization history on file for this patient.   Objective: Vital Signs: BP (!) 178/111 (BP Location: Right Arm, Patient Position: Sitting, Cuff Size: Normal)   Pulse 97   Temp 98 F (36.7 C)   Resp 13  Ht 5' 3 (1.6 m)   Wt 233 lb 3.2 oz (105.8 kg)   BMI 41.31 kg/m    Physical Exam Vitals and nursing note reviewed.  HENT:     Head: Normocephalic and atraumatic.     Nose: Nose normal.  Eyes:     Conjunctiva/sclera: Conjunctivae normal.     Pupils: Pupils are equal, round, and reactive to light.  Pulmonary:     Effort: Pulmonary effort is normal. No respiratory distress.  Skin:    General: Skin is warm and dry.  Neurological:     Mental Status: She is alert. Mental status is at baseline.     Motor: No weakness (5/5 b/l proximal UE and LE).  Psychiatric:        Mood and Affect: Mood normal.        Behavior: Behavior normal.      Musculoskeletal Exam:   CDAI Exam: CDAI Score: -- Patient Global: --; Provider Global: -- Swollen: 0 ; Tender: 0  Joint Exam 10/05/2024   All documented joints were normal     Investigation: No additional findings.  Imaging: No results found.  Recent Labs: Lab Results  Component Value Date   WBC 8.3 03/23/2020    HGB 14.3 07/01/2021   PLT 323 03/23/2020   NA 136 04/28/2024   K 3.5 04/28/2024   CL 103 04/28/2024   CO2 25 04/28/2024   GLUCOSE 96 04/28/2024   BUN 13 04/28/2024   CREATININE 0.56 04/28/2024   BILITOT 0.9 03/23/2020   ALKPHOS 60 03/23/2020   AST 15 03/23/2020   ALT 12 03/23/2020   PROT 7.1 03/23/2020   ALBUMIN 3.9 03/23/2020   CALCIUM 9.1 04/28/2024   GFRAA >60 03/23/2020    Speciality Comments: No specialty comments available.  Procedures:  No procedures performed Allergies: Codeine, Levothyroxine sodium, and Lisinopril   Assessment / Plan:     Visit Diagnoses:  Myalgia Lower extremity weakness; subjective Patient with b/l lower extremity muscle pain w/ subjective weakness of unclear etiology at this time. As discussed with patient, her negative CK and lack of objective muscle weakness on exam are reassuring for lack of inflammatory muscle disease. Furthermore, also discussed with patient that her rheumatologic autoimmune muscle diseases classically present with painless weakness, which does not correlate with this patient's presentation. She also denies any rashes, which would be possible with more of an amyotrophic presentation.  Will check aldolase today to rule out any possible inflammatory muscle disease. However, suspect that patient's lower extremity symptoms may be due to hx of lumbar radiculopathy. Advised patient that the next best step would be an EMG, which could look for alternative etiologies of her presentation. Referral to neurology placed today for further evaluation.  Ambulatory referral to Orthopedic Surgery for spine also placed due to concern of lumbar etiology.   Patient's TSH was also mildly elevated on recent evaluation by PCP (>4), however T4 was wnl, so unclear if this could be clinically significant to explain her presentation.   Positive ANA Patient with positive ANA and very mildly positive SSA (1.0). Patient does not have any features of SLE  (no objective malar rash, inflammatory arthritis, Raynaud's, alopecia, recurrent cytopenias), Sjogren's disease (no sicca symptoms), scleroderma (no sclerodactyly, no Raynaud's).  Discussed with patient that a positive ANA need not represent presence of clinically active systemic autoimmune disease.  Can be positive in the normal population, autoimmune thyroid  disease (Graves' disease, Hashimoto's thyroiditis etc.), or infection. ANA can be positive in the healthy population  at the following rates: ANA 1:40: 20%-30%, ANA 1:80: 10%-15%, ANA 1:160: 5%, ANA 1:320: 3% positive, healthy relative of an SLE patient: 5%-25% positive (usually low titers), and elderly (age >70 years): up to 70% positive at ANA titer 1:40.  Discussed with patient that given very low positive SSA and lack of significant sicca symptoms, suspect that positive ANA is clinically insignificant at this time. Also discussed with patient that even if she did have sjogren's syndrome, it would not explain her symptoms at this present time. Patient verbalizes understanding.   Orders: Orders Placed This Encounter  Procedures   Aldolase   Ambulatory referral to Neurology   Ambulatory referral to Orthopedic Surgery   No orders of the defined types were placed in this encounter.   I personally spent a total of 60 minutes in the care of the patient today including preparing to see the patient, getting/reviewing separately obtained history, performing a medically appropriate exam/evaluation, counseling and educating, placing orders, referring and communicating with other health care professionals, and documenting clinical information in the EHR.   Follow-Up Instructions: Return if symptoms worsen or fail to improve.   Asberry Claw, DO  Note - This record has been created using Animal nutritionist.  Chart creation errors have been sought, but may not always  have been located. Such creation errors do not reflect on  the standard of medical  care.

## 2024-10-05 NOTE — Telephone Encounter (Signed)
 Patient contacted the office requesting a referral to Neuro surgeon in [place] Dr. Reeves Daisy.  Patient request referral for surgery.  Patient's preferred area for referral is: [employer] number is [phone]

## 2024-10-06 ENCOUNTER — Telehealth: Payer: Self-pay

## 2024-10-06 NOTE — Telephone Encounter (Signed)
 Returned call to patient. Patient advised Dr. Luba discussed neurology and orthopedic referrals for her. Patient expressed understanding and will call to set up appointment wit Orthopedics. Patient states she has an appointment with neurology this month.

## 2024-10-06 NOTE — Telephone Encounter (Signed)
 Patient left a voicemail requesting a return call to let her know if her referral can be changed to Dr. Reeves Daisy, neurosurgeon in [place].

## 2024-10-09 LAB — ALDOLASE: Aldolase: 3 U/L (ref <=8.1)

## 2024-10-13 ENCOUNTER — Ambulatory Visit: Payer: Self-pay

## 2024-10-18 ENCOUNTER — Telehealth: Payer: Self-pay | Admitting: Neurology

## 2024-10-18 ENCOUNTER — Ambulatory Visit (INDEPENDENT_AMBULATORY_CARE_PROVIDER_SITE_OTHER): Admitting: Neurology

## 2024-10-18 ENCOUNTER — Encounter: Payer: Self-pay | Admitting: Neurology

## 2024-10-18 VITALS — BP 159/93 | HR 105 | Ht 64.0 in | Wt 229.0 lb

## 2024-10-18 DIAGNOSIS — M6289 Other specified disorders of muscle: Secondary | ICD-10-CM | POA: Diagnosis not present

## 2024-10-18 DIAGNOSIS — R29898 Other symptoms and signs involving the musculoskeletal system: Secondary | ICD-10-CM

## 2024-10-18 DIAGNOSIS — M47819 Spondylosis without myelopathy or radiculopathy, site unspecified: Secondary | ICD-10-CM

## 2024-10-18 NOTE — Telephone Encounter (Signed)
 Pt would like MRI to be done at [facility] for her

## 2024-10-18 NOTE — Progress Notes (Signed)
 [facility]  PATIENT: Jasmine Lawson DOB: 1961-06-29  REFERRING DOCTOR OR PCP:  Deward Soja, MD; Cena Buttner, [place] SOURCE: Patient, notes from primary care and rheumatology, imaging and lab results  _________________________________   HISTORICAL  CHIEF COMPLAINT:  Chief Complaint  Patient presents with   New Patient (Initial Visit)    RM11, ALONE,  referral for NCS/EMG for lower extremity Armida Buttner, PA-C [facility] [phone]: VISION TEST COMPLETED     HISTORY OF PRESENT ILLNESS:  I had the pleasure of seeing your patient, Jasmine Lawson, at [facility] for neurologic consultation regarding her right greater than left leg pain and weakness.  She is a 63 yo woman who has had right > left  leg pain that has progressed over the past year, since late 2024.   Currently, she notes mild leg weakness.  She has a crampy sensation in her leg muscles increased after she stands and easing up a bit if she walks further.    She has been using a cane due to more stumbles and a few falls (one was a trip but the other 3 were due to losing balance and having difficulty catching herself).   Legs give out on her at times, right more so than left.    She will have tingling on the inside of her legs (left > right).   She has interstitial cystitis ad has some urgency but no incontinence  Labs had shown mildly positive ANA and elevated SSA.  CK reportedly normal though I do not have this result.   She has no sicca.  She has seen Dr. Luba (Rheum) for suspected inflammatory issues but has ben told this is unlikely   Imaging: I personally reviewed the MRI of the lumbar spine from 03/17/2018.  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  She has a disc protrusion to the left at L1-L2 causing borderline spinal stenosis but no nerve root compression.  There is also a broad disc protrusion at L1-L2 and disc bulging at L2-L3.  At  L3-L4 she has mild spinal stenosis due to disc bulging, facet hypertrophy and minimal ligamenta flava hypertrophy.  There is mild to moderate foraminal narrowing to the left and mild to the right and some lateral recess stenosis though there is no nerve root compression.  At L4-L5 and L5-S1 she has facet hypertrophy but no nerve root compression or spinal stenosis.  REVIEW OF SYSTEMS: Constitutional: No fevers, chills, sweats, or change in appetite Eyes: No visual changes, double vision, eye pain Ear, nose and throat: No hearing loss, ear pain, nasal congestion, sore throat Cardiovascular: No chest pain, palpitations Respiratory:  No shortness of breath at rest or with exertion.   No wheezes GastrointestinaI: No nausea, vomiting, diarrhea, abdominal pain, fecal incontinence Genitourinary:  No dysuria, urinary retention or frequency.  No nocturia. Musculoskeletal:  No neck pain, back pain Integumentary: No rash, pruritus, skin lesions Neurological: as above Psychiatric: No depression at this time.  No anxiety Endocrine: No palpitations, diaphoresis, change in appetite, change in weigh or increased thirst Hematologic/Lymphatic:  No anemia, purpura, petechiae. Allergic/Immunologic: No itchy/runny eyes, nasal congestion, recent allergic reactions, rashes  ALLERGIES: Allergies  Allergen Reactions   Codeine Other (See Comments)    Vomiting.  Other Reaction(s): Unknown   Levothyroxine Other (See Comments)    Other Reaction(s): Unknown   Levothyroxine Sodium Other (See Comments)    Leg pain  Other Reaction(s): mouth itching  Lisinopril Other (See Comments)    Tongue swelling  Other Reaction(s): Unknown    HOME MEDICATIONS:  Current Outpatient Medications:    acetaminophen  (TYLENOL ) 500 MG tablet, Take 500 mg by mouth every 6 (six) hours as needed., Disp: , Rfl:    albuterol (VENTOLIN HFA) 108 (90 Base) MCG/ACT inhaler, Inhale 2 puffs into the lungs every 6 (six) hours as needed.,  Disp: , Rfl:    cetirizine (ZYRTEC) 10 MG tablet, Take 10 mg by mouth at bedtime., Disp: , Rfl:    cholecalciferol (VITAMIN D3) 25 MCG (1000 UNIT) tablet, Take 1,000 Units by mouth daily. Takes 2 tablets per day, Disp: , Rfl:    clotrimazole -betamethasone  (LOTRISONE ) cream, Apply 1 Application topically 2 (two) times daily., Disp: 45 g, Rfl: 1   conjugated estrogens (PREMARIN) vaginal cream, Place 1 Applicatorful vaginally. Patient states that she uses once a week., Disp: , Rfl:    dicyclomine  (BENTYL ) 10 MG capsule, Take 1 capsule (10 mg total) by mouth 2 (two) times daily as needed for spasms., Disp: 60 capsule, Rfl: 4   fluconazole  (DIFLUCAN ) 150 MG tablet, Take 1 now and repeat 1 in 3 days (Patient taking differently: as needed. Take 1 now and repeat 1 in 3 days), Disp: 2 tablet, Rfl: 2   levothyroxine (SYNTHROID) 75 MCG tablet, Take 75 mcg by mouth daily before breakfast., Disp: , Rfl:    Magnesium 200 MG TABS, Take 2 tablets by mouth daily., Disp: , Rfl:    montelukast (SINGULAIR) 10 MG tablet, Take 10 mg by mouth at bedtime., Disp: , Rfl:    omeprazole  (PRILOSEC) 40 MG capsule, Take 1 capsule (40 mg total) by mouth daily., Disp: 90 capsule, Rfl: 3   OVER THE COUNTER MEDICATION, Systane eye drops bid, Disp: , Rfl:    spironolactone (ALDACTONE) 25 MG tablet, Take 12.5 mg by mouth daily. , Disp: , Rfl:    Turmeric 500 MG CAPS, Take 2 capsules by mouth daily., Disp: , Rfl:    XIIDRA 5 % SOLN, Apply 1 drop to eye 2 (two) times daily., Disp: , Rfl:   PAST MEDICAL HISTORY: Past Medical History:  Diagnosis Date   Arthritis 06/25/2021   legs feet and hands   Complication of anesthesia    COVID 11/2020   cold symptoms x 2 to 3 days all symtoms resolved   Hematuria 06/25/2021   Hypertension 06/25/2021   Hypothyroidism 06/25/2021   IC (interstitial cystitis)    Lumbar radiculopathy 06/25/2021   left side, last injection was 2020 per pt   Palpitations 06/25/2021   PONV (postoperative nausea  and vomiting)    SUI (stress urinary incontinence, female) 06/25/2021   occ   Wears glasses 06/25/2021    PAST SURGICAL HISTORY: Past Surgical History:  Procedure Laterality Date   ABDOMINAL HYSTERECTOMY  2001   bladder stretch  08/2022   cataract Bilateral 2023   CESAREAN SECTION     x 2   CHOLECYSTECTOMY     non-functioning by Dr. Barry yrs ago laparoscopic 20 yrs ago per pt   COLONOSCOPY N/A 04/12/2014   Procedure: COLONOSCOPY;  Surgeon: Claudis RAYMOND Rivet, MD;  [facility];  Service: Endoscopy;  Laterality: N/A;  225 - moved to 730 - Ann notified pt   COLONOSCOPY N/A 05/03/2024   Procedure: COLONOSCOPY;  Surgeon: Eartha Angelia Sieving, MD;  [facility];  Service: Gastroenterology;  Laterality: N/A;  11:15AM;ASA 1   CYSTO WITH HYDRODISTENSION Bilateral 07/01/2021   Procedure: CYSTOSCOPY/HYDRODISTENSION INSTILLATION  OF MARCAINE  AND PYRIDIUM  BILATERAL RETROGRADES;  Surgeon: Watt Rush, MD;  [facility];  Service: Urology;  Laterality: Bilateral;   ESOPHAGEAL DILATION N/A 05/03/2024   Procedure: DILATION, ESOPHAGUS;  Surgeon: Eartha Angelia Sieving, MD;  [facility];  Service: Gastroenterology;  Laterality: N/A;  11:15AM;ASA 1   ESOPHAGOGASTRODUODENOSCOPY N/A 05/03/2024   Procedure: EGD (ESOPHAGOGASTRODUODENOSCOPY);  Surgeon: Eartha Angelia, Sieving, MD;  [facility];  Service: Gastroenterology;  Laterality: N/A;  11:15AM;ASA 1   Fissure tear repair     many yrs ago per pt on 06-25-2021   Rt rotator cuff repair Right    15 to 20 yrs ago per pt on 06-25-2021    FAMILY HISTORY: Family History  Problem Relation Age of Onset   Heart failure Mother    Deep vein thrombosis Mother    CAD Father    Cancer Sister        pancreatic   Rheum arthritis Sister    Fibromyalgia Sister    Atrial fibrillation Sister    Osteoarthritis Brother    Thyroid  disease Daughter    Crohn's disease Daughter    Thyroid   disease Daughter    Hashimoto's thyroiditis Daughter    Pancreatic cancer Nephew    Cancer Other        maternal nephew-pancreatic   Colon cancer Neg Hx     SOCIAL HISTORY: Social History   Socioeconomic History   Marital status: Married    Spouse name: Not on file   Number of children: Not on file   Years of education: Not on file   Highest education level: Not on file  Occupational History   Not on file  Tobacco Use   Smoking status: Never    Passive exposure: Past   Smokeless tobacco: Never  Vaping Use   Vaping status: Never Used  Substance and Sexual Activity   Alcohol use: No   Drug use: No   Sexual activity: Yes    Birth control/protection: Surgical    Comment: hyst  Other Topics Concern   Not on file  Social History Narrative   Not on file   Social Drivers of Health   Financial Resource Strain: Low Risk  (03/10/2023)   Overall Financial Resource Strain (CARDIA)    Difficulty of Paying Living Expenses: Not hard at all  Food Insecurity: No Food Insecurity (03/10/2023)   Hunger Vital Sign    Worried About Running Out of Food in the Last Year: Never true    Ran Out of Food in the Last Year: Never true  Transportation Needs: No Transportation Needs (03/10/2023)   PRAPARE - Administrator, Civil Service (Medical): No    Lack of Transportation (Non-Medical): No  Physical Activity: Sufficiently Active (03/10/2023)   Exercise Vital Sign    Days of Exercise per Week: 6 days    Minutes of Exercise per Session: 30 min  Stress: No Stress Concern Present (03/10/2023)   [employer] of Occupational Health - Occupational Stress Questionnaire    Feeling of Stress : Only a little  Social Connections: Moderately Integrated (03/10/2023)   Social Connection and Isolation Panel    Frequency of Communication with Friends and Family: More than three times a week    Frequency of Social Gatherings with Friends and Family: More than three times a week    Attends  Religious Services: More than 4 times per year    Active Member of Clubs or Organizations: No  Attends Banker Meetings: Never    Marital Status: Married  Catering Manager Violence: Not At Risk (03/10/2023)   Humiliation, Afraid, Rape, and Kick questionnaire    Fear of Current or Ex-Partner: No    Emotionally Abused: No    Physically Abused: No    Sexually Abused: No       PHYSICAL EXAM  Vitals:   10/18/24 0945 10/18/24 0950  BP: (!) 172/90 (!) 159/93  Pulse: (!) 105   Weight: 229 lb (103.9 kg)   Height: 5' 4 (1.626 m)     Body mass index is 39.31 kg/m.   General: The patient is well-developed and well-nourished and in no acute distress  HEENT:  Head is /AT.  Sclera are anicteric.  Funduscopic exam shows normal optic discs and retinal vessels.  Neck: No carotid bruits are noted.  The neck is nontender.  Cardiovascular: The heart has a regular rate and rhythm with a normal S1 and S2. There were no murmurs, gallops or rubs.    Skin: Extremities are without rash or  edema.  Musculoskeletal:  Back is nontender  Neurologic Exam  Mental status: The patient is alert and oriented x 3 at the time of the examination. The patient has apparent normal recent and remote memory, with an apparently normal attention span and concentration ability.   Speech is normal.  Cranial nerves: Extraocular movements are full. Pupils are equal, round, and reactive to light and accomodation.  Visual fields are full.  Facial symmetry is present. There is good facial sensation to soft touch bilaterally.Facial strength is normal.  Trapezius and sternocleidomastoid strength is normal. No dysarthria is noted.  The tongue is midline, and the patient has symmetric elevation of the soft palate. No obvious hearing deficits are noted.  Motor:  Muscle bulk is normal.   Tone is normal. Can rise from chair and sit 4 times easily without use of arms.  Strength is  5 / 5 in all arm muscles except  4+/5 in deltoid.  Stength was 4/5 right and 4+/5 left iliopsoas and   Sensory: Sensory testing is intact to pinprick, soft touch and vibration sensation in all 4 extremities.  Coordination: Cerebellar testing reveals good finger-nose-finger and heel-to-shin bilaterally.  Gait and station: Station is normal.   Does not lift right leg up well, arthritic, not spastic.   Cannot safely tandem walk without support.  . Romberg is negative.   Reflexes: Deep tendon reflexes are symmetric and 2 in arms and ankles and 3 at knees.   Plantar responses are flexor.    DIAGNOSTIC DATA (LABS, IMAGING, TESTING) - I reviewed patient records, labs, notes, testing and imaging myself where available.  Lab Results  Component Value Date   WBC 8.3 03/23/2020   HGB 14.3 07/01/2021   HCT 42.0 07/01/2021   MCV 81.8 03/23/2020   PLT 323 03/23/2020      Component Value Date/Time   NA 136 04/28/2024 0850   K 3.5 04/28/2024 0850   CL 103 04/28/2024 0850   CO2 25 04/28/2024 0850   GLUCOSE 96 04/28/2024 0850   BUN 13 04/28/2024 0850   CREATININE 0.56 04/28/2024 0850   CALCIUM 9.1 04/28/2024 0850   PROT 7.1 03/23/2020 0600   ALBUMIN 3.9 03/23/2020 0600   AST 15 03/23/2020 0600   ALT 12 03/23/2020 0600   ALKPHOS 60 03/23/2020 0600   BILITOT 0.9 03/23/2020 0600   GFRNONAA >60 04/28/2024 0850   GFRAA >60 03/23/2020 0600  ASSESSMENT AND PLAN Weakness of both lower extremities  Proximal weakness of extremity  Facet hypertrophy  In summary, Jasmine Lawson is a 63 year old woman who has had progressive leg weakness and pain over the last year.  On exam she had mild proximal leg weakness and also had minimal proximal shoulder weakness.  She was able to stand up and sit down for time straight without using her arms.  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  Most likely, her symptoms are due to progression of her lumbar degeneration.  However, since she also had proximal greater than distal  weakness I will check a creatinine kinase and acetylcholine receptor antibodies.  We will set up an NCV/EMG to further evaluate and help sort out the etiology of her symptoms.  The MRI of the lumbar spine will be performed and compared to the 2019 study to determine if progression is causing her symptoms.  If so, she may need referral to neurosurgery.  She will return to see me after the studies based on the results.  She should call sooner for new or worsening neurologic symptoms.     Mabell Esguerra A. Vear, MD, [facility] 10/18/2024, 10:38 AM Certified in Neurology, Clinical Neurophysiology, Sleep Medicine and Neuroimaging  [facility] [address], [address] [place], [place] [address] [phone]

## 2024-10-21 ENCOUNTER — Ambulatory Visit: Payer: Self-pay | Admitting: Neurology

## 2024-10-21 LAB — CK: Total CK: 50 U/L (ref 32–182)

## 2024-10-21 LAB — ACETYLCHOLINE RECEPTOR, BINDING: AChR Binding Ab, Serum: <0.07 nmol/L (ref 0.00–0.24)

## 2024-10-23 NOTE — Telephone Encounter (Signed)
 She is schedule at [facility] on 12/4. UMR shara: [id number] exp. 12/24/24

## 2024-10-24 ENCOUNTER — Other Ambulatory Visit (HOSPITAL_BASED_OUTPATIENT_CLINIC_OR_DEPARTMENT_OTHER): Payer: Self-pay

## 2024-10-24 MED ORDER — FLUZONE 0.5 ML IM SUSY
0.5000 mL | PREFILLED_SYRINGE | Freq: Once | INTRAMUSCULAR | 0 refills | Status: AC
  Filled 2024-10-24: qty 0.5, 1d supply, fill #0

## 2024-10-26 ENCOUNTER — Ambulatory Visit (HOSPITAL_COMMUNITY): Admission: RE | Admit: 2024-10-26 | Discharge: 2024-10-26 | Attending: Neurology | Admitting: Neurology

## 2024-10-26 DIAGNOSIS — R29898 Other symptoms and signs involving the musculoskeletal system: Secondary | ICD-10-CM | POA: Diagnosis present

## 2024-10-26 DIAGNOSIS — M47819 Spondylosis without myelopathy or radiculopathy, site unspecified: Secondary | ICD-10-CM | POA: Diagnosis present

## 2024-10-31 ENCOUNTER — Other Ambulatory Visit: Payer: Self-pay | Admitting: Neurology

## 2024-10-31 DIAGNOSIS — M5416 Radiculopathy, lumbar region: Secondary | ICD-10-CM

## 2024-11-08 NOTE — Progress Notes (Unsigned)
 Referring Physician:  Vear Charlie LABOR, MD [address] [place],  [place] [address]  Primary Physician:  Atilano Deward ORN, MD  History of Present Illness: 11/08/2024 Ms. Jasmine Lawson is here today with a chief complaint of ***   Low back pain.  Leg pain and weakness in both legs?   Duration: *** Location: *** Quality: *** Severity: ***  Precipitating: aggravated by *** Modifying factors: made better by *** Weakness: none Timing: *** Bowel/Bladder Dysfunction: none  Conservative measures:  Physical therapy: *** has not participated in for her back Multimodal medical therapy including regular antiinflammatories: *** tylenol  Injections: *** no epidural steroid injections for her back  Past Surgery: ***no spinal surgeries   Jasmine Lawson has ***no symptoms of cervical myelopathy.  The symptoms are causing a significant impact on the patient's life.   I have utilized the care everywhere function in epic to review the outside records available from external health systems.   Review of Systems:  A 10 point review of systems is negative, except for the pertinent positives and negatives detailed in the HPI.  Past Medical History: Past Medical History:  Diagnosis Date   Arthritis 06/25/2021   legs feet and hands   Complication of anesthesia    COVID 11/2020   cold symptoms x 2 to 3 days all symtoms resolved   Hematuria 06/25/2021   Hypertension 06/25/2021   Hypothyroidism 06/25/2021   IC (interstitial cystitis)    Lumbar radiculopathy 06/25/2021   left side, last injection was 2020 per pt   Palpitations 06/25/2021   PONV (postoperative nausea and vomiting)    SUI (stress urinary incontinence, female) 06/25/2021   occ   Wears glasses 06/25/2021    Past Surgical History: Past Surgical History:  Procedure Laterality Date   ABDOMINAL HYSTERECTOMY  2001   bladder stretch  08/2022   cataract Bilateral 2023   CESAREAN SECTION     x 2   CHOLECYSTECTOMY      non-functioning by Dr. Barry yrs ago laparoscopic 20 yrs ago per pt   COLONOSCOPY N/A 04/12/2014   Procedure: COLONOSCOPY;  Surgeon: Claudis RAYMOND Rivet, MD;  [facility];  Service: Endoscopy;  Laterality: N/A;  225 - moved to 730 - Ann notified pt   COLONOSCOPY N/A 05/03/2024   Procedure: COLONOSCOPY;  Surgeon: Eartha Angelia Sieving, MD;  [facility];  Service: Gastroenterology;  Laterality: N/A;  11:15AM;ASA 1   CYSTO WITH HYDRODISTENSION Bilateral 07/01/2021   Procedure: CYSTOSCOPY/HYDRODISTENSION INSTILLATION OF MARCAINE  AND PYRIDIUM  BILATERAL RETROGRADES;  Surgeon: Watt Rush, MD;  Location[facility];  Service: Urology;  Laterality: Bilateral;   ESOPHAGEAL DILATION N/A 05/03/2024   Procedure: DILATION, ESOPHAGUS;  Surgeon: Eartha Angelia Sieving, MD;  [facility];  Service: Gastroenterology;  Laterality: N/A;  11:15AM;ASA 1   ESOPHAGOGASTRODUODENOSCOPY N/A 05/03/2024   Procedure: EGD (ESOPHAGOGASTRODUODENOSCOPY);  Surgeon: Eartha Angelia, Sieving, MD;  [facility];  Service: Gastroenterology;  Laterality: N/A;  11:15AM;ASA 1   Fissure tear repair     many yrs ago per pt on 06-25-2021   Rt rotator cuff repair Right    15 to 20 yrs ago per pt on 06-25-2021    Allergies: Allergies as of 11/14/2024 - Review Complete 10/18/2024  Allergen Reaction Noted   Codeine Other (See Comments) 03/12/2014   Levothyroxine Other (See Comments) 09/17/2020   Levothyroxine sodium Other (See Comments) 09/17/2020   Lisinopril Other (See Comments) 09/17/2020    Medications: Current Medications[1]  Social History: Social History[2]  Family Medical History: Family History  Problem Relation Age of Onset   Heart failure Mother    Deep vein thrombosis Mother    CAD Father    Cancer Sister        pancreatic   Rheum arthritis Sister    Fibromyalgia Sister    Atrial fibrillation Sister    Osteoarthritis Brother    Thyroid  disease  Daughter    Crohn's disease Daughter    Thyroid  disease Daughter    Hashimoto's thyroiditis Daughter    Pancreatic cancer Nephew    Cancer Other        maternal nephew-pancreatic   Colon cancer Neg Hx     Physical Examination: There were no vitals filed for this visit.  General: Patient is in no apparent distress. Attention to examination is appropriate.  Neck:   Supple.  Full range of motion.  Respiratory: Patient is breathing without any difficulty.   NEUROLOGICAL:     Awake, alert, oriented to person, place, and time.  Speech is clear and fluent.   Cranial Nerves: Pupils equal round and reactive to light.  Facial tone is symmetric.  Facial sensation is symmetric. Shoulder shrug is symmetric. Tongue protrusion is midline.  There is no pronator drift.  Strength: Side Biceps Triceps Deltoid Interossei Grip Wrist Ext. Wrist Flex.  R 5 5 5 5 5 5 5   L 5 5 5 5 5 5 5    Side Iliopsoas Quads Hamstring PF DF EHL  R 5 5 5 5 5 5   L 5 5 5 5 5 5    Reflexes are ***2+ and symmetric at the biceps, triceps, brachioradialis, patella and achilles.   Hoffman's is absent.   Bilateral upper and lower extremity sensation is intact to light touch.    No evidence of dysmetria noted.  Gait is normal.     Medical Decision Making  Imaging: ***  I have personally reviewed the images and agree with the above interpretation.  Assessment and Plan: Jasmine Lawson is a pleasant 63 y.o. female with ***      Thank you for involving me in the care of this patient.      Chester K. Clois MD, [facility] Neurosurgery     [1]  Current Outpatient Medications:    acetaminophen  (TYLENOL ) 500 MG tablet, Take 500 mg by mouth every 6 (six) hours as needed., Disp: , Rfl:    albuterol (VENTOLIN HFA) 108 (90 Base) MCG/ACT inhaler, Inhale 2 puffs into the lungs every 6 (six) hours as needed., Disp: , Rfl:    cetirizine (ZYRTEC) 10 MG tablet, Take 10 mg by mouth at bedtime., Disp: , Rfl:     cholecalciferol (VITAMIN D3) 25 MCG (1000 UNIT) tablet, Take 1,000 Units by mouth daily. Takes 2 tablets per day, Disp: , Rfl:    clotrimazole -betamethasone  (LOTRISONE ) cream, Apply 1 Application topically 2 (two) times daily., Disp: 45 g, Rfl: 1   conjugated estrogens (PREMARIN) vaginal cream, Place 1 Applicatorful vaginally. Patient states that she uses once a week., Disp: , Rfl:    dicyclomine  (BENTYL ) 10 MG capsule, Take 1 capsule (10 mg total) by mouth 2 (two) times daily as needed for spasms., Disp: 60 capsule, Rfl: 4   fluconazole  (DIFLUCAN ) 150 MG tablet, Take 1 now and repeat 1 in 3 days (Patient taking differently: as needed. Take 1 now and repeat 1 in 3 days), Disp: 2 tablet, Rfl: 2   levothyroxine (SYNTHROID) 75 MCG tablet, Take 75 mcg by  mouth daily before breakfast., Disp: , Rfl:    Magnesium 200 MG TABS, Take 2 tablets by mouth daily., Disp: , Rfl:    montelukast (SINGULAIR) 10 MG tablet, Take 10 mg by mouth at bedtime., Disp: , Rfl:    omeprazole  (PRILOSEC) 40 MG capsule, Take 1 capsule (40 mg total) by mouth daily., Disp: 90 capsule, Rfl: 3   OVER THE COUNTER MEDICATION, Systane eye drops bid, Disp: , Rfl:    spironolactone (ALDACTONE) 25 MG tablet, Take 12.5 mg by mouth daily. , Disp: , Rfl:    Turmeric 500 MG CAPS, Take 2 capsules by mouth daily., Disp: , Rfl:    XIIDRA 5 % SOLN, Apply 1 drop to eye 2 (two) times daily., Disp: , Rfl:  [2]  Social History Tobacco Use   Smoking status: Never    Passive exposure: Past   Smokeless tobacco: Never  Vaping Use   Vaping status: Never Used  Substance Use Topics   Alcohol use: No   Drug use: No

## 2024-11-14 ENCOUNTER — Ambulatory Visit: Admitting: Neurosurgery

## 2024-11-14 ENCOUNTER — Encounter: Payer: Self-pay | Admitting: Neurosurgery

## 2024-11-14 VITALS — BP 174/96 | Ht 64.0 in | Wt 219.0 lb

## 2024-11-14 DIAGNOSIS — R29898 Other symptoms and signs involving the musculoskeletal system: Secondary | ICD-10-CM

## 2024-11-19 ENCOUNTER — Ambulatory Visit (HOSPITAL_COMMUNITY)

## 2024-11-24 ENCOUNTER — Ambulatory Visit (HOSPITAL_COMMUNITY)
Admission: RE | Admit: 2024-11-24 | Discharge: 2024-11-24 | Disposition: A | Discharge status: Home / Self Care | Source: Ambulatory Visit | Attending: Neurosurgery | Admitting: Neurosurgery

## 2024-11-24 DIAGNOSIS — R29898 Other symptoms and signs involving the musculoskeletal system: Secondary | ICD-10-CM

## 2024-12-04 ENCOUNTER — Encounter: Payer: Self-pay | Admitting: Neurosurgery

## 2024-12-06 ENCOUNTER — Ambulatory Visit: Admitting: Physician Assistant

## 2024-12-20 ENCOUNTER — Ambulatory Visit: Admitting: Diagnostic Neuroimaging

## 2025-01-10 ENCOUNTER — Encounter: Admitting: Neurology
